# Patient Record
Sex: Female | Born: 1980
Health system: Southern US, Community
[De-identification: ages and names within clinical notes are randomized; demographics above are authoritative.]

## PROBLEM LIST (undated history)

## (undated) HISTORY — PX: BUNIONECTOMY: SHX129

## (undated) HISTORY — PX: KNEE SURGERY: SHX244

## (undated) HISTORY — PX: CHOLECYSTECTOMY OPEN: SUR202

## (undated) HISTORY — PX: ANKLE SURGERY: SHX546

## (undated) HISTORY — PX: TUBAL LIGATION: SHX77

---

## 2000-11-22 ENCOUNTER — Other Ambulatory Visit: Admission: RE | Admit: 2000-11-22 | Discharge: 2000-11-22 | Payer: Self-pay | Admitting: Family Medicine

## 2001-01-17 ENCOUNTER — Encounter: Payer: Self-pay | Admitting: Family Medicine

## 2001-01-17 ENCOUNTER — Ambulatory Visit (HOSPITAL_COMMUNITY): Admission: RE | Admit: 2001-01-17 | Discharge: 2001-01-17 | Payer: Self-pay | Admitting: Family Medicine

## 2001-04-19 ENCOUNTER — Ambulatory Visit (HOSPITAL_COMMUNITY): Admission: RE | Admit: 2001-04-19 | Discharge: 2001-04-19 | Payer: Self-pay | Admitting: Family Medicine

## 2001-04-19 ENCOUNTER — Encounter: Payer: Self-pay | Admitting: Family Medicine

## 2001-06-09 ENCOUNTER — Encounter: Payer: Self-pay | Admitting: Family Medicine

## 2001-06-09 ENCOUNTER — Inpatient Hospital Stay (HOSPITAL_COMMUNITY): Admission: RE | Admit: 2001-06-09 | Discharge: 2001-06-09 | Payer: Self-pay | Admitting: Family Medicine

## 2001-06-11 ENCOUNTER — Inpatient Hospital Stay (HOSPITAL_COMMUNITY): Admission: AD | Admit: 2001-06-11 | Discharge: 2001-06-13 | Payer: Self-pay | Admitting: Family Medicine

## 2001-11-28 ENCOUNTER — Other Ambulatory Visit: Admission: RE | Admit: 2001-11-28 | Discharge: 2001-11-28 | Payer: Self-pay | Admitting: Family Medicine

## 2002-12-20 ENCOUNTER — Other Ambulatory Visit: Admission: RE | Admit: 2002-12-20 | Discharge: 2002-12-20 | Payer: Self-pay | Admitting: Family Medicine

## 2004-07-01 ENCOUNTER — Other Ambulatory Visit: Admission: RE | Admit: 2004-07-01 | Discharge: 2004-07-01 | Payer: Self-pay | Admitting: Family Medicine

## 2004-10-05 ENCOUNTER — Observation Stay (HOSPITAL_COMMUNITY): Admission: AD | Admit: 2004-10-05 | Discharge: 2004-10-07 | Payer: Self-pay | Admitting: Obstetrics and Gynecology

## 2005-02-22 ENCOUNTER — Inpatient Hospital Stay (HOSPITAL_COMMUNITY): Admission: AD | Admit: 2005-02-22 | Discharge: 2005-02-25 | Payer: Self-pay | Admitting: Obstetrics & Gynecology

## 2005-03-19 ENCOUNTER — Other Ambulatory Visit: Admission: RE | Admit: 2005-03-19 | Discharge: 2005-03-19 | Payer: Self-pay | Admitting: Obstetrics and Gynecology

## 2005-03-25 ENCOUNTER — Ambulatory Visit (HOSPITAL_COMMUNITY): Admission: RE | Admit: 2005-03-25 | Discharge: 2005-03-25 | Payer: Self-pay | Admitting: Obstetrics and Gynecology

## 2005-05-22 ENCOUNTER — Inpatient Hospital Stay (HOSPITAL_COMMUNITY): Admission: EM | Admit: 2005-05-22 | Discharge: 2005-05-30 | Payer: Self-pay | Admitting: Emergency Medicine

## 2007-05-19 ENCOUNTER — Emergency Department (HOSPITAL_COMMUNITY): Admission: EM | Admit: 2007-05-19 | Discharge: 2007-05-19 | Payer: Self-pay | Admitting: Emergency Medicine

## 2007-07-24 ENCOUNTER — Ambulatory Visit (HOSPITAL_COMMUNITY): Admission: RE | Admit: 2007-07-24 | Discharge: 2007-07-25 | Payer: Self-pay | Admitting: *Deleted

## 2007-07-24 ENCOUNTER — Encounter (INDEPENDENT_AMBULATORY_CARE_PROVIDER_SITE_OTHER): Payer: Self-pay | Admitting: *Deleted

## 2010-05-19 NOTE — Op Note (Signed)
Jacqueline Andrade, Jacqueline Andrade             ACCOUNT NO.:  [id number]   MEDICAL RECORD NO.:  [id number]          PATIENT TYPE:  OIB   LOCATION:  1343                         FACILITY:  [facility]   PHYSICIAN:  Alfonse Ras, MD   DATE OF BIRTH:  February 19, 1980   DATE OF PROCEDURE:  DATE OF DISCHARGE:                               OPERATIVE REPORT   PREOPERATIVE DIAGNOSIS:  Symptomatic cholelithiasis.   POSTOPERATIVE DIAGNOSIS:  Symptomatic cholelithiasis.   PROCEDURES:  Laparoscopic cholecystectomy.   SURGEON:  Alfonse Ras, M.D.   ASSISTANT:  Anselm Pancoast. Zachery Dakins, M.D.   ANESTHESIA:  General.   DESCRIPTION:  The patient was taken to the operating room, placed in the  supine position after adequate general anesthesia was induced using  endotracheal tube.  The abdomen was prepped and draped in normal sterile  fashion.  Using a transverse infraumbilical incision, I dissected down  to fascia.  Fascia was opened vertically.  An 0-Vicryl pursestring  suture was placed on the fascial defect.  Hasson trocar was placed in  the abdomen and pneumoperitoneum was obtained.  Under direct vision, 11  mm trocar was placed in subxiphoid region.  Two 5 mm trocars were placed  in the right abdomen.  The gallbladder was identified and retracted  cephalad.  Dissection of the neck of the gallbladder obtained a critical  view of the cystic duct, its junction with the gallbladder and the  common bile duct.  It was triply clipped and divided after the critical  view was verified.  Cystic artery was identified in a similar fashion,  triply clipped and divided.  The gallbladder was taken off the  gallbladder bed using Bovie electrocautery.  Small rent was made in this  more superior portion of the gallbladder and moderate sized stone was  retrieved.  The gallbladder was placed in EndoCatch bag.  Adequate  hemostasis was assured.  The right upper quadrant was copiously  irrigated.  The gallbladder was removed  through the umbilical port.  The  pneumoperitoneum was released and infraumbilical fascial defect was  closed with the 0 Vicryl purse-string suture.  Skin incisions were  closed with subcuticular 4-0 Monocryl.  Steri-Strips, sterile dressings  were applied.  The patient tolerated the procedure well, went to PACU in  good condition.      Alfonse Ras, MD  Electronically Signed     KRE/MEDQ  D:  07/24/2007  T:  07/25/2007  Job:  [id number]   cc:   [facility]

## 2010-05-19 NOTE — Consult Note (Signed)
NAME:  Jacqueline Andrade, GOON NO.:  [id number]   MEDICAL RECORD NO.:  [id number]          PATIENT TYPE:  EMS   LOCATION:  MAJO                         FACILITY:  [facility]   PHYSICIAN:  Alfonse Ras, MD   DATE OF BIRTH:  January 06, 1980   DATE OF CONSULTATION:  DATE OF DISCHARGE:                                 CONSULTATION   CHIEF COMPLAINT:  Retrosternal chest pain and history of gallstones.   HISTORY OF PRESENT ILLNESS:  The patient is a very pleasant 30 year old  female with a history of cholecystitis in the past, last 2 years ago and  prior to that was 7 years ago, and known gallstones.  The patient awoke  today at 2 o'clock this morning about 5 hours ago with retrosternal  chest pain and heartburn symptoms.  She tried to treat this with Mylanta  and had no relief.  She denies any right upper quadrant abdominal pain.  She did have a [place] sandwich last night and a McFlurry from  [employer].  She denies any acholic stools or dark urine.   PAST MEDICAL HISTORY:  None.   PAST SURGICAL HISTORY:  Significant only for a tubal ligation.   PHYSICAL EXAMINATION:  Physical exam shows a temperature of 97.2 and  heart rate of 84.  HEENT:  Benign.  Normocephalic and atraumatic.  Pupils are equal, round,  and reactive to light.  NECK:  Supple and soft without thyromegaly or cervical adenopathy.  LUNGS:  Clear to auscultation and percussion x2.  HEART:  Regular rate and rhythm without murmurs, rubs, or gallops.  ABDOMEN:  Soft, completely nontender even with deep palpation in the  right upper quadrant.  EXTREMITIES:  Show no clubbing, cyanosis, or edema.   Ultrasound shows a small contracted gallbladder with gallstones, normal  common bile duct diameter.  The white count is 8,000.  Liver function  tests are completely within normal limits.  Urine pregnancy is negative.   IMPRESSION:  Probable gastroesophageal reflux disease with acute  exacerbation and known history of  cholelithiasis.   PLAN:  Medical treatment of gastroesophageal reflux disease and elective  cholecystectomy.  I have discussed this with the patient and her husband  and they will see me in the office and we will schedule elective  cholecystectomy.      Alfonse Ras, MD  Electronically Signed     KRE/MEDQ  D:  05/19/2007  T:  05/19/2007  Job:  [id number]

## 2010-05-22 NOTE — H&P (Signed)
NAME:  Jacqueline Andrade, Jacqueline Andrade NO.:  [id number]   MEDICAL RECORD NO.:  [id number]          PATIENT TYPE:  AMB   LOCATION:  SDC                           FACILITY:  [facility]   PHYSICIAN:  Juluis Mire, M.D.   DATE OF BIRTH:  [birth year]   DATE OF ADMISSION:  DATE OF DISCHARGE:                                HISTORY & PHYSICAL   The patient is a 30 year old gravida 2, para 2, married female who presents  for laparoscopic bilateral tubal fulguration.  The potential irreversibility  of sterilization was explained.  We discussed alternatives for birth  control.  The patient still desires permanent sterilization.  Potential  failure rate of one in 200 is quoted.  Failures can be in the form of  ectopic pregnancy requiring further surgical management.   ALLERGIES:  No known drug allergies.   MEDICATIONS:  Vitamins.   PAST MEDICAL HISTORY:  The usual childhood diseases, no significant  sequelae.   PAST SURGICAL HISTORY:  She has had a hammer toe fixed, otherwise no other  surgical history.   OBSTETRICAL HISTORY:  Two vaginal deliveries with viable infants.   FAMILY HISTORY:  Noncontributory.   SOCIAL HISTORY:  There is no tobacco or alcohol use.   REVIEW OF SYSTEMS:  Noncontributory.   PHYSICAL EXAMINATION:  VITAL SIGNS:  The patient is afebrile with stable  vital signs.  HEENT:  Patient normocephalic.  Pupils equal, round and reactive to light  and accommodation, extraocular movements were intact.  Sclerae and  conjunctivae clear.  Oropharynx clear.  NECK:  Without thyromegaly.  BREASTS:  Glandular but no discrete masses.  LUNGS:  Clear.  CARDIAC:  Regular rhythm and rate, no murmurs or gallops.  ABDOMEN:  Benign.  No mass, organomegaly or tenderness.  PELVIC:  Normal external genitalia.  Vaginal mucosa is clear.  Cervix  unremarkable.  Uterus normal size, shape and contour.  Adnexa free of mass  or tenderness.  EXTREMITIES:  Trace edema.  NEUROLOGIC:  Grossly within  normal limits.   IMPRESSION:  Multiparity, desires sterility.   PLAN:  The patient will undergo laparoscopic bilateral tubal fulguration.  Risks of surgery have been discussed, including the risk of infection; the  risk of hemorrhage that could require transfusion and the risk of AIDS of  hepatitis; the risk of injury to adjacent organs including bladder, bowel or  ureters, that could require further exploratory surgery; the risk of deep  venous thrombosis and pulmonary embolus.  The patient expressed an  understanding of the indications, risks and other options.      Juluis Mire, M.D.  Electronically Signed     JSM/MEDQ  D:  03/25/2005  T:  03/25/2005  Job:  [id number]
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
## 2010-05-22 NOTE — Op Note (Signed)
[name], Jacqueline Andrade NO.:  [id number]   MEDICAL RECORD NO.:  [id number]          PATIENT TYPE:  AMB   LOCATION:  SDC                           FACILITY:  [facility]   PHYSICIAN:  Juluis Mire, M.D.   DATE OF BIRTH:  [birth year]   DATE OF PROCEDURE:  03/25/2005  DATE OF DISCHARGE:                                 OPERATIVE REPORT   PREOPERATIVE DIAGNOSIS:  Multiparity, desires sterility.   POSTOPERATIVE DIAGNOSIS:  Multiparity, desires sterility.   OPERATION/PROCEDURE:  Laparoscopic bilateral tubal fulguration.   SURGEON:  Juluis Mire, M.D.   ANESTHESIA:  General.   ESTIMATED BLOOD LOSS:  Minimal.   PACKS AND DRAINS:  None.   INTRAOPERATIVE BLOOD REPLACED:  None.   COMPLICATIONS:  None.   INDICATIONS:  Dictated in history and physical.   DESCRIPTION OF PROCEDURE:  The patient was taken to the OR and placed in the  supine position.  After satisfactory level of general anesthesia was  obtained, the patient was placed in the dorsal lithotomy position using the  Allen stirrups.  The abdomen, perineum and vagina were prepped out with  Betadine.  Bladder was drained with in-and-out catheterization.  A Hulka  tenaculum was put in place and secured.  A subumbilical incision was made  with the knife.  The Veress needle inserted into the abdominal cavity  without difficulty.  Abdomen insufflated with approximately  3 L or carbon  dioxide.  Operating laparoscope was introduced.  Visualization of the  revealed a normal appendix.  Upper abdomen including liver and tip of the  gallbladder were cleared.  Tubes and ovaries were unremarkable.  Uterus was  upper limits of normal size.  Using the bipolar, a 2-3 cm segment of each  fallopian tube was coagulated until resistance read 0 on the meter.  We then  recoagulated the same segment of tube, completely desiccating the tube.  Coagulation did extend out to mesosalpinx.  At the end of the procedure both  tubes were  adequately coagulated.  No other pelvic pathology or  complications were encountered.  Abdomen was deflated with  carbon dioxide.  Trocars were removed.  Subumbilical incision was closed  with interrupted subcuticular of 4-0 Vicryl.  The Hulka tenaculum was then  removed.  The patient was taken out of the dorsal lithotomy position and  once alert and extubated, was transferred to the recovery room in good  condition.      Juluis Mire, M.D.  Electronically Signed     JSM/MEDQ  D:  03/25/2005  T:  03/26/2005  Job:  [id number]
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
## 2010-05-22 NOTE — H&P (Signed)
NAMEFRANCESSCA, FRIIS             ACCOUNT NO.:  [id number]   MEDICAL RECORD NO.:  [id number]          PATIENT TYPE:  INP   LOCATION:  9302                          FACILITY:  [facility]   PHYSICIAN:  Dineen Kid. Rana Snare, M.D.    DATE OF BIRTH:  Dec 08, 1980   DATE OF ADMISSION:  10/05/2004  DATE OF DISCHARGE:                                HISTORY & PHYSICAL   HISTORY OF PRESENT ILLNESS:  Ms. Jacqueline Andrade is a 30 year old G2, P1, at 17-6/7  weeks who presented this morning to the [facility] with a  chief complaint of right upper quadrant pain, midepigastric pain, nausea,  and vomiting.  Apparently she underwent full evaluation at the hospital,  including laboratory and ultrasound evaluation, and was told that she had  gallbladder disease, and according to her family, they were ready to do  surgery to remove her gallbladder.  They left to come to our practice and  wanted further evaluation here since she is pregnant.  Her estimated date of  confinement is March 09, 2005, making her 17-6/7 weeks.  She had an  ultrasound last week, showing a female fetus with normal anatomy.  On further  examination, she does have a history of gastroesophageal reflux with pain  and nausea and vomiting really just began this morning.  She has had three  episodes of nausea and vomiting this morning.  At this time she appears to  be in mild to moderate discomfort in the right upper quadrant, but no nausea  or vomiting as of recently.   LABORATORY DATA:  Her laboratory evaluation from [facility] includes  comprehensive metabolic package, which was essentially normal with a normal  amylase and lipase, normal SGOT/SGPT.  Her CBC was also normal with a normal  white count of 9.1, hemoglobin of 12, platelets of 252,000.  Her BUN and  creatinine were also normal, as well as remaining of her electrolytes.  Sodium mildly depressed at 134.  Her total bilirubin and direct bilirubin  were 0.4 and 0.2 with indirect  bilirubin of 0.2, all within normal limits.  She also had urinalysis which showed trace ketones and an ultrasound  evaluation again performed at [facility] which shows no pancreatic  lesions, normal hepatopetal flow within the main portal vein.  Hepatic veins  are also patent.  The gallbladder wall is not thickened.  Numerous calculi  are seen within the gallbladder, represent cholelithiasis with the largest  calculus measuring 9 mm.  There is a positive Murphy's sign, but no  pericholecystic fluid is seen.  No polyp or masses are seen.  The bile ducts  are normal in caliber.   PHYSICAL EXAMINATION:  ABDOMEN:  Gravid.  Mild tenderness in the right upper  quadrant to mid epigastric area.  No rebound or guarding is noted.  Normal  active bowel sounds are noted.  No flank pain.  Fetal heart tones are in the  150s.   IMPRESSION/PLAN:  Right upper quadrant pain and mid epigastric pain with  normal laboratory evaluation.  Gallbladder ultrasound shows cholelithiasis.   PLAN:  Admit for IV fluids, antiemetics.  No evidence of infection at this  time with a normal white count and afebrile.  Will plan management of emesis  and IV fluids tonight and further evaluation tomorrow.  If she is not  improving, consider GI referral or evaluation by general surgeon.     Dineen Kid Rana Snare, M.D.  Electronically Signed    DCL/MEDQ  D:  10/05/2004  T:  10/05/2004  Job:  [id number]

## 2010-05-22 NOTE — Discharge Summary (Signed)
Jacqueline Andrade, Jacqueline Andrade             ACCOUNT NO.:  [id number]   MEDICAL RECORD NO.:  [id number]          PATIENT TYPE:  INP   LOCATION:  9302                          FACILITY:  [facility]   PHYSICIAN:  Duke Salvia. Marcelle Overlie, M.D.DATE OF BIRTH:  1980-06-14   DATE OF ADMISSION:  10/05/2004  DATE OF DISCHARGE:  10/07/2004                                 DISCHARGE SUMMARY   ADMISSION DIAGNOSES:  1.  Intrauterine pregnancy at 17-6/7 weeks estimated gestational age.  2.  Right upper quadrant pain.  3.  Mild epigastric pain.   DISCHARGE DIAGNOSES:  1.  Intrauterine pregnancy at 18 weeks estimated gestational age.  2.  Cholelithiasis.  3.  Gastroesophageal reflux disease.   REASON FOR ADMISSION:  Please see dictated H&P.   HOSPITAL COURSE:  The patient is 30 year old gravida 2, para 1 that was  admitted to [facility] at 17-6/7 weeks. Chief complaint was  right upper quadrant pain and mid gastric pain, nausea, vomiting. The  patient had received her prenatal care through [facility]. The  patient apparently had undergone full evaluation at the hospital including  laboratory, and ultrasound evaluation. The patient had been told that she  had gallbladder disease and it was recommended she have surgical removal of  her gallbladder. Due to the fact the patient was pregnant, family decided  they wanted further evaluation and a second opinion. Upon further evaluation  the patient did have a history of a gastroesophageal reflux with pain and  nausea and vomiting that really had just started the morning of admission.  The patient had had three episodes of nausea, vomiting. At the time of  admission the patient appeared to be in mild to moderate discomfort in the  right upper quadrant but no nausea, vomiting at that time.   LABORATORY DATA:  Revealed a normal amylase and lipase with normal SGOT and  SGPT. CBC was also normal with a white count of 9.1 thousand, a hemoglobin  of 12,  platelet count of 252,000. BUN and creatinine were also within normal  limits as well as the remaining electrolytes. Sodium was mildly depressed at  134. Total bilirubin, direct and indirect well within normal limits.  Urinalysis had shown trace ketones. Ultrasound evaluation that had been  performed at [facility] had also had also revealed no pancreatic  lesions with normal hepatic flow in the main portal vein. Gallbladder wall  was not thickened. Numerous calculi however were seen within the gallbladder  representing chololithiasis with the largest calculus measuring  approximately 9 mm. Further exam had revealed a gravid uterus with mild  tenderness in the right upper quadrant to mid epigastric area, no rebound or  guarding was noted. Bowel sounds were noted to be normal. No flank pain.  Fetal heart tones were in the 150's.  Decision was made to admit the patient  for IV fluids and antibiotics. On the following morning the patient did  report  some mild nausea, however, she had not had any further emesis. She  did complain of some epigastric pain and chest pressure. She denied  shortness of breath and or  diaphoresis. No contractions were noted. No  vaginal bleeding, and fetus was active per patient report. Vital signs were  stable. Temperature max 98.6. Heart was noted to be regular rate and rhythm.  Abdomen soft and nontender. LFTs continued to be within normal limits. The  patient was started on Protonix and Zofran, and a bland diet. Due to the  complaints of chest pain an EKG was performed. However, we suspected reflux.  Later that afternoon the patient was feeling better. She had no further  nausea, vomiting. An EKG was normal sinus rhythm. Vital signs were stable  and she continued to be afebrile. On the following morning the patient  stated that she felt much improved. She denied any shortness of breath,  vital signs were stable. She was afebrile. Abdomen soft with good  bowel  sounds. Uterus was nontender. The patient was later discharged home.   CONDITION ON DISCHARGE:  Stable. Diet as tolerated.   ACTIVITY:  No limitations.   FOLLOW UP:  The patient was scheduled to return to the office in 1 week. She  is to call for increase of nausea, vomiting or right upper quadrant pain,  vaginal bleeding or increase in uterine  contractions.   DISCHARGE MEDICATIONS:  Protonix 40 milligrams one p.o. daily. Prenatal  vitamins one p.o. daily.      Julio Sicks, N.P.      Richard M. Marcelle Overlie, M.D.  Electronically Signed    CC/MEDQ  D:  12/15/2004  T:  12/15/2004  Job:  [id number]
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
## 2010-09-30 LAB — URINALYSIS, ROUTINE W REFLEX MICROSCOPIC
Bilirubin Urine: NEGATIVE
Glucose, UA: NEGATIVE
Ketones, ur: NEGATIVE
Nitrite: NEGATIVE
Protein, ur: NEGATIVE
Specific Gravity, Urine: 1.015
Urobilinogen, UA: 1
pH: 7.5

## 2010-09-30 LAB — COMPREHENSIVE METABOLIC PANEL
ALT: 26
Albumin: 3.5
BUN: 6
Calcium: 9.3
Chloride: 105
Creatinine, Ser: 0.56
GFR calc Af Amer: >60
GFR calc non Af Amer: >60
Sodium: 138
Total Protein: 6.9

## 2010-09-30 LAB — COMPREHENSIVE METABOLIC PANEL WITH GFR
AST: 27
Alkaline Phosphatase: 65
CO2: 25
Glucose, Bld: 118 — ABNORMAL HIGH
Potassium: 3.6
Total Bilirubin: 0.6

## 2010-09-30 LAB — DIFFERENTIAL
Basophils Absolute: 0
Basophils Relative: 0
Eosinophils Absolute: 0.1
Eosinophils Relative: 1
Lymphocytes Relative: 22
Lymphs Abs: 1.8
Monocytes Absolute: 0.5
Monocytes Relative: 5
Neutro Abs: 6.1
Neutrophils Relative %: 72

## 2010-09-30 LAB — PROTIME-INR
INR: 1
Prothrombin Time: 12.9

## 2010-09-30 LAB — PREGNANCY, URINE: Preg Test, Ur: NEGATIVE

## 2010-09-30 LAB — APTT: aPTT: 30

## 2010-09-30 LAB — URINE MICROSCOPIC-ADD ON

## 2010-09-30 LAB — CBC
HCT: 35.7 — ABNORMAL LOW
Hemoglobin: 12.2
MCHC: 34.1
MCV: 84.2
Platelets: 269
RBC: 4.24
RDW: 14.1
WBC: 8.5

## 2010-09-30 LAB — URINE CULTURE: Colony Count: >=100000

## 2010-09-30 LAB — LIPASE, BLOOD: Lipase: 19

## 2010-10-02 LAB — DIFFERENTIAL
Eosinophils Relative: 1
Lymphocytes Relative: 28
Lymphs Abs: 1.9
Neutrophils Relative %: 64

## 2010-10-02 LAB — COMPREHENSIVE METABOLIC PANEL
AST: 21
CO2: 23
Calcium: 8.9
Creatinine, Ser: 0.52
GFR calc Af Amer: >60
GFR calc non Af Amer: >60
Total Protein: 7.1

## 2010-10-02 LAB — CBC
MCHC: 33.6
MCV: 83.5
Platelets: 235
RBC: 4.39
RDW: 13.8

## 2012-07-19 ENCOUNTER — Encounter: Payer: Self-pay | Admitting: *Deleted

## 2012-07-24 ENCOUNTER — Other Ambulatory Visit: Payer: Self-pay | Admitting: General Practice

## 2012-07-26 ENCOUNTER — Ambulatory Visit (INDEPENDENT_AMBULATORY_CARE_PROVIDER_SITE_OTHER): Payer: BC Managed Care – PPO | Admitting: General Practice

## 2012-07-26 ENCOUNTER — Encounter: Payer: Self-pay | Admitting: General Practice

## 2012-07-26 VITALS — BP 117/82 | HR 71 | Temp 97.9°F | Ht 66.0 in | Wt 162.0 lb

## 2012-07-26 DIAGNOSIS — Z124 Encounter for screening for malignant neoplasm of cervix: Secondary | ICD-10-CM

## 2012-07-26 DIAGNOSIS — R5381 Other malaise: Secondary | ICD-10-CM

## 2012-07-26 DIAGNOSIS — Z0000 Encounter for general adult medical examination without abnormal findings: Secondary | ICD-10-CM

## 2012-07-26 DIAGNOSIS — Z833 Family history of diabetes mellitus: Secondary | ICD-10-CM

## 2012-07-26 LAB — POCT CBC
MCHC: 32.6 g/dL (ref 31.8–35.4)
MPV: 7.3 fL (ref 0–99.8)
POC Granulocyte: 6.5 (ref 2–6.9)
POC LYMPH PERCENT: 25.5 %L (ref 10–50)
RDW, POC: 14 %
WBC: 9.3 10*3/uL (ref 4.6–10.2)

## 2012-07-26 LAB — COMPLETE METABOLIC PANEL WITH GFR
CO2: 27 mEq/L (ref 19–32)
Creat: 0.59 mg/dL (ref 0.50–1.10)
GFR, Est African American: >89 mL/min
GFR, Est Non African American: >89 mL/min
Glucose, Bld: 91 mg/dL (ref 70–99)
Total Bilirubin: 0.3 mg/dL (ref 0.3–1.2)

## 2012-07-26 LAB — POCT GLYCOSYLATED HEMOGLOBIN (HGB A1C): Hemoglobin A1C: 5.4

## 2012-07-26 NOTE — Progress Notes (Signed)
  Subjective:    Patient ID: Jacqueline Andrade, female    DOB: 1980-03-01, 32 y.o.   MRN: [id number]  HPI Patient presents today for annual physical. She denies having any complaints at this time. She reports working on eating healthier and regular exercise. She reports last menstrual cycle began the last week of June.     Review of Systems  Constitutional: Negative for fever and chills.  HENT: Negative for neck pain and neck stiffness.   Respiratory: Negative for cough, chest tightness and shortness of breath.   Cardiovascular: Negative for chest pain and palpitations.  Gastrointestinal: Negative for abdominal pain and blood in stool.  Genitourinary: Negative for dysuria, hematuria and difficulty urinating.  Neurological: Negative for dizziness, weakness and headaches.       Objective:   Physical Exam  Constitutional: She is oriented to person, place, and time. She appears well-developed and well-nourished.  HENT:  Head: Normocephalic and atraumatic.  Right Ear: External ear normal.  Left Ear: External ear normal.  Mouth/Throat: Oropharynx is clear and moist.  Eyes: EOM are normal. Pupils are equal, round, and reactive to light.  Neck: Normal range of motion. Neck supple. No thyromegaly present.  Cardiovascular: Normal rate, regular rhythm and normal heart sounds.   Pulmonary/Chest: Effort normal and breath sounds normal. No respiratory distress. She exhibits no tenderness. Right breast exhibits no inverted nipple, no mass, no nipple discharge, no skin change and no tenderness. Left breast exhibits no inverted nipple, no mass, no nipple discharge, no skin change and no tenderness. Breasts are symmetrical.  Abdominal: Soft. Bowel sounds are normal. She exhibits no distension. There is no tenderness.  Genitourinary: No breast swelling, tenderness, discharge or bleeding. There is no rash, tenderness, lesion or injury on the right labia. There is no rash, tenderness, lesion or injury on  the left labia. Uterus is not deviated, not enlarged, not fixed and not tender. Cervix exhibits no motion tenderness, no discharge and no friability. Right adnexum displays no mass, no tenderness and no fullness. Left adnexum displays no mass, no tenderness and no fullness. No erythema, tenderness or bleeding around the vagina. No foreign body around the vagina. No signs of injury around the vagina. No vaginal discharge found.  Musculoskeletal: Normal range of motion.  Lymphadenopathy:    She has no cervical adenopathy.  Neurological: She is alert and oriented to person, place, and time.  Skin: Skin is warm and dry.  Psychiatric: She has a normal mood and affect.          Assessment & Plan:  1. Annual physical exam - POCT CBC - COMPLETE METABOLIC PANEL WITH GFR - NMR Lipoprofile with Lipids - Pap IG, CT/NG w/ reflex HPV when ASC-U  2. Other malaise and fatigue - Thyroid Panel With TSH - Vitamin B12  3. Family history of diabetes mellitus - POCT glycosylated hemoglobin (Hb A1C) -Continue all current medications Labs pending F/u in 1 year Discussed exercise and healthy eating Patient verbalized understanding Coralie Keens, FNP-C

## 2012-07-26 NOTE — Patient Instructions (Addendum)

## 2012-07-27 LAB — NMR LIPOPROFILE WITH LIPIDS
HDL Particle Number: 30.1 umol/L — ABNORMAL LOW (ref >=30.5)
LDL (calc): 99 mg/dL (ref <100)
LDL Particle Number: 1536 nmol/L — ABNORMAL HIGH (ref <1000)
LDL Size: 20.6 nm (ref >20.5)
LP-IR Score: 64 — ABNORMAL HIGH (ref <=45)
Small LDL Particle Number: 781 nmol/L — ABNORMAL HIGH (ref <=527)
VLDL Size: 57 nm — ABNORMAL HIGH (ref <=46.6)

## 2012-07-27 LAB — PAP IG, CT-NG, RFX HPV ASCU: GC Probe Amp: NEGATIVE

## 2012-07-27 LAB — THYROID PANEL WITH TSH: T4, Total: 8.6 ug/dL (ref 5.0–12.5)

## 2013-08-17 ENCOUNTER — Encounter: Payer: Self-pay | Admitting: Family

## 2013-08-17 ENCOUNTER — Other Ambulatory Visit: Payer: Self-pay | Admitting: Family

## 2013-08-17 ENCOUNTER — Ambulatory Visit (INDEPENDENT_AMBULATORY_CARE_PROVIDER_SITE_OTHER): Payer: BC Managed Care – PPO | Admitting: Family

## 2013-08-17 ENCOUNTER — Encounter (INDEPENDENT_AMBULATORY_CARE_PROVIDER_SITE_OTHER): Payer: Self-pay

## 2013-08-17 VITALS — BP 105/61 | HR 67 | Temp 99.0°F | Ht 66.0 in | Wt 177.8 lb

## 2013-08-17 DIAGNOSIS — Z124 Encounter for screening for malignant neoplasm of cervix: Secondary | ICD-10-CM

## 2013-08-17 DIAGNOSIS — Z0000 Encounter for general adult medical examination without abnormal findings: Secondary | ICD-10-CM

## 2013-08-17 DIAGNOSIS — Z01419 Encounter for gynecological examination (general) (routine) without abnormal findings: Secondary | ICD-10-CM

## 2013-08-17 DIAGNOSIS — Z202 Contact with and (suspected) exposure to infections with a predominantly sexual mode of transmission: Secondary | ICD-10-CM

## 2013-08-17 LAB — POCT URINALYSIS DIPSTICK
Bilirubin, UA: NEGATIVE
Glucose, UA: NEGATIVE
Nitrite, UA: NEGATIVE
PH UA: 5
SPEC GRAV UA: 1.025

## 2013-08-17 LAB — POCT UA - MICROSCOPIC ONLY
CASTS, UR, LPF, POC: NEGATIVE
CRYSTALS, UR, HPF, POC: NEGATIVE
Yeast, UA: NEGATIVE

## 2013-08-17 MED ORDER — NITROFURANTOIN MONOHYD MACRO 100 MG PO CAPS: 100.0000 mg | ORAL_CAPSULE | Freq: Two times a day (BID) | ORAL | Status: DC

## 2013-08-17 NOTE — Progress Notes (Signed)
   Subjective:    Patient ID: Jacqueline Andrade, female    DOB: 1980-08-22, 33 y.o.   MRN: [id number]  Gynecologic Exam Pertinent negatives include no headaches.   Pt presents to the office for annual physical with pap. Pt denies any pain, palpations, SOB, or edema. No complaints at this time.    Review of Systems  Constitutional: Negative.   HENT: Negative.   Eyes: Negative.   Respiratory: Negative.  Negative for shortness of breath.   Cardiovascular: Negative.  Negative for palpitations.  Gastrointestinal: Negative.   Endocrine: Negative.   Genitourinary: Negative.   Musculoskeletal: Negative.   Neurological: Negative.  Negative for headaches.  Hematological: Negative.   Psychiatric/Behavioral: Negative.   All other systems reviewed and are negative.      Objective:   Physical Exam  Vitals reviewed. Constitutional: She is oriented to person, place, and time. She appears well-developed and well-nourished. No distress.  HENT:  Head: Normocephalic and atraumatic.  Right Ear: External ear normal.  Mouth/Throat: Oropharynx is clear and moist.  Eyes: Pupils are equal, round, and reactive to light.  Neck: Normal range of motion. Neck supple. No thyromegaly present.  Cardiovascular: Normal rate, regular rhythm, normal heart sounds and intact distal pulses.   No murmur heard. Pulmonary/Chest: Effort normal and breath sounds normal. No respiratory distress. She has no wheezes. Right breast exhibits no inverted nipple, no mass, no nipple discharge, no skin change and no tenderness. Left breast exhibits no inverted nipple, no mass, no nipple discharge, no skin change and no tenderness. Breasts are symmetrical.  Abdominal: Soft. Bowel sounds are normal. She exhibits no distension. There is no tenderness.  Genitourinary: Vagina normal. Guaiac negative stool.  Bimanual exam- no adnexal masses or tenderness, ovaries nonpalpable   Cervix parous and pink- No discharge   Musculoskeletal:  Normal range of motion. She exhibits no edema and no tenderness.  Neurological: She is alert and oriented to person, place, and time. She has normal reflexes. No cranial nerve deficit.  Skin: Skin is warm and dry.  Psychiatric: She has a normal mood and affect. Her behavior is normal. Judgment and thought content normal.    BP 105/61  Pulse 67  Temp(Src) 99 F (37.2 C) (Oral)  Ht 5\' 6"  (1.676 m)  Wt 177 lb 12.8 oz (80.65 kg)  BMI 28.71 kg/m2       Assessment & Plan:  1. Annual physical exam - Pap IG, CT/NG w/ reflex HPV when ASC-U - POCT UA - Microscopic Only - POCT urinalysis dipstick  2. Encounter for routine gynecological examination  3. Potential exposure to STD - Pap IG, CT/NG w/ reflex HPV when ASC-U - STD Screening Panel/High Risk - GC/Chlamydia Probe Amp - HIV antibody (with reflex)   Labs discussed-Pt had blood work drawn at work and will be scanned into chart Health Maintenance reviewed Diet and exercise encouraged RTO 1 year  Jannifer Rodneyhristy Hawks, FNP

## 2013-08-17 NOTE — Patient Instructions (Signed)

## 2013-08-17 NOTE — Addendum Note (Signed)
Addended by: Prescott GumLAND, Mashonda Broski M on: 08/17/2013 11:47 AM   Modules accepted: Orders

## 2013-08-18 LAB — STD SCREENING PANEL/HIGH RISK
HIV 1/HIV 2 AB: NONREACTIVE
HIV 1/O/2 Abs-Index Value: <1 (ref <1.00)
HSV 1 IGG, TYPE SPEC: 52 {index} — AB (ref 0.00–0.90)
Hep A IgM: NEGATIVE
Hep B C IgM: NEGATIVE
Hepatitis B Surface Ag: NEGATIVE
SYPHILIS RPR SCR: NONREACTIVE

## 2013-08-23 LAB — GC/CHLAMYDIA PROBE AMP
Chlamydia trachomatis, NAA: NEGATIVE
Neisseria gonorrhoeae by PCR: NEGATIVE

## 2013-08-24 LAB — PAP IG, CT-NG, RFX HPV ASCU
Chlamydia, Nuc. Acid Amp: NEGATIVE
GONOCOCCUS BY NUCLEIC ACID AMP: NEGATIVE
PAP Smear Comment: 0

## 2013-10-29 ENCOUNTER — Ambulatory Visit (INDEPENDENT_AMBULATORY_CARE_PROVIDER_SITE_OTHER): Payer: BC Managed Care – PPO | Admitting: *Deleted

## 2013-10-29 DIAGNOSIS — Z23 Encounter for immunization: Secondary | ICD-10-CM

## 2013-11-05 ENCOUNTER — Encounter: Payer: Self-pay | Admitting: Family

## 2014-08-01 ENCOUNTER — Ambulatory Visit (INDEPENDENT_AMBULATORY_CARE_PROVIDER_SITE_OTHER): Payer: BLUE CROSS/BLUE SHIELD

## 2014-08-01 ENCOUNTER — Encounter: Payer: Self-pay | Admitting: Family

## 2014-08-01 ENCOUNTER — Ambulatory Visit (INDEPENDENT_AMBULATORY_CARE_PROVIDER_SITE_OTHER): Payer: BLUE CROSS/BLUE SHIELD | Admitting: Family

## 2014-08-01 VITALS — BP 119/85 | HR 89 | Temp 97.9°F | Ht 66.0 in | Wt 183.8 lb

## 2014-08-01 DIAGNOSIS — M25571 Pain in right ankle and joints of right foot: Secondary | ICD-10-CM

## 2014-08-01 DIAGNOSIS — M19071 Primary osteoarthritis, right ankle and foot: Secondary | ICD-10-CM

## 2014-08-01 DIAGNOSIS — M129 Arthropathy, unspecified: Secondary | ICD-10-CM

## 2014-08-01 DIAGNOSIS — M25471 Effusion, right ankle: Secondary | ICD-10-CM

## 2014-08-01 MED ORDER — TRAMADOL HCL 50 MG PO TABS: 50.0000 mg | ORAL_TABLET | Freq: Three times a day (TID) | ORAL | Status: DC | PRN

## 2014-08-01 NOTE — Patient Instructions (Signed)
Ankle Exercises for Rehabilitation Following ankle injuries, it is as important to follow your caregiver's instructions for regaining full use of your ankle as it was to follow the initial treatment plan following the injury. The following are some suggestions for exercises and treatment, which can be done to help you regain full use of your ankle as soon as possible.  Follow all instructions regarding physical therapy.  Before exercising, it may be helpful to use heat on the muscles or joint being exercised. This loosens up the muscles and tendons (cordlike structure) and decreases chances of injury during your exercises. If this is not possible, just begin your exercises slowly to gradually warm up.  Stand on your toes several times per day to strengthen the calf muscles. These are the muscles in the back of your leg between the knee and the heel. The cord you can feel just above the heel is the Achilles tendon. Rise up on your toes several times repeating this three to four times per day. Do not exercise to the point of pain. If pain starts to develop, decrease the exercise until you are comfortable again.  Do range of motion exercises. This means moving the ankle in all directions. Practice writing the alphabet with your toes in the air. Do not increase beyond a range that is comfortable.  Increase the strength of the muscles in the front of your leg by raising your toes and foot straight up in the air. Repeat this exercise as you did the calf exercise with the same warnings. This also help to stretch your muscles.  Stretch your calf muscles also by leaning against a wall with your hands in front of you. Put your feet a few feet from the wall and bend your knees until you feel the muscles in your calves become tight.  After exercising it may be helpful to put ice on the ankle to prevent swelling and improve rehabilitation. This may be done for 15 to 20 minutes following your exercises. If  exercising is being done in the workplace, this may not always be possible.  Taping an ankle injury may be helpful to give added support following an injury. It also may help prevent reinjury. This may be true if you are in training or in a conditioning program. You and your caregiver can decide on the best course of action to follow. Document Released: 12/19/1999 Document Revised: 05/07/2013 Document Reviewed: 12/16/2007 Exit[employer] Patient Information 2015 [employer]. This information is not intended to replace advice given to you by your health care provider. Make sure you discuss any questions you have with your health care provider.  
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
## 2014-08-19 ENCOUNTER — Other Ambulatory Visit: Payer: Self-pay | Admitting: Family

## 2014-08-19 ENCOUNTER — Ambulatory Visit (INDEPENDENT_AMBULATORY_CARE_PROVIDER_SITE_OTHER): Payer: BLUE CROSS/BLUE SHIELD | Admitting: Family

## 2014-08-19 ENCOUNTER — Encounter: Payer: Self-pay | Admitting: Family

## 2014-08-19 VITALS — BP 115/77 | HR 72 | Temp 98.6°F | Ht 66.0 in | Wt 185.6 lb

## 2014-08-19 DIAGNOSIS — M129 Arthropathy, unspecified: Secondary | ICD-10-CM | POA: Diagnosis not present

## 2014-08-19 DIAGNOSIS — Z0000 Encounter for general adult medical examination without abnormal findings: Secondary | ICD-10-CM

## 2014-08-19 DIAGNOSIS — M19071 Primary osteoarthritis, right ankle and foot: Secondary | ICD-10-CM

## 2014-08-19 LAB — POCT UA - MICROSCOPIC ONLY
BACTERIA, U MICROSCOPIC: NEGATIVE
CASTS, UR, LPF, POC: NEGATIVE
Crystals, Ur, HPF, POC: NEGATIVE
Mucus, UA: NEGATIVE
YEAST UA: NEGATIVE

## 2014-08-19 LAB — POCT URINALYSIS DIPSTICK
Bilirubin, UA: NEGATIVE
Glucose, UA: NEGATIVE
Ketones, UA: NEGATIVE
Leukocytes, UA: NEGATIVE
NITRITE UA: NEGATIVE
PH UA: 6
PROTEIN UA: NEGATIVE
Spec Grav, UA: 1.015
UROBILINOGEN UA: NEGATIVE

## 2014-08-19 NOTE — Patient Instructions (Signed)
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
## 2014-08-20 ENCOUNTER — Other Ambulatory Visit: Payer: Self-pay | Admitting: Family

## 2014-08-20 DIAGNOSIS — Z01419 Encounter for gynecological examination (general) (routine) without abnormal findings: Secondary | ICD-10-CM

## 2014-08-20 LAB — CBC WITH DIFFERENTIAL/PLATELET
BASOS ABS: 0 10*3/uL (ref 0.0–0.2)
BASOS: 0 %
EOS (ABSOLUTE): 0.1 10*3/uL (ref 0.0–0.4)
Eos: 1 %
HEMOGLOBIN: 12.5 g/dL (ref 11.1–15.9)
Hematocrit: 37.4 % (ref 34.0–46.6)
Immature Grans (Abs): 0 10*3/uL (ref 0.0–0.1)
Immature Granulocytes: 0 %
LYMPHS ABS: 2 10*3/uL (ref 0.7–3.1)
Lymphs: 38 %
MCH: 28.3 pg (ref 26.6–33.0)
MCHC: 33.4 g/dL (ref 31.5–35.7)
MCV: 85 fL (ref 79–97)
MONOCYTES: 5 %
Monocytes Absolute: 0.3 10*3/uL (ref 0.1–0.9)
NEUTROS ABS: 3 10*3/uL (ref 1.4–7.0)
Neutrophils: 56 %
Platelets: 270 10*3/uL (ref 150–379)
RBC: 4.42 x10E6/uL (ref 3.77–5.28)
RDW: 14.5 % (ref 12.3–15.4)
WBC: 5.3 10*3/uL (ref 3.4–10.8)

## 2014-08-20 LAB — CMP14+EGFR
ALK PHOS: 73 IU/L (ref 39–117)
ALT: 17 IU/L (ref 0–32)
AST: 14 IU/L (ref 0–40)
Albumin/Globulin Ratio: 1.1 (ref 1.1–2.5)
Albumin: 4 g/dL (ref 3.5–5.5)
BUN/Creatinine Ratio: 17 (ref 8–20)
BUN: 10 mg/dL (ref 6–20)
Bilirubin Total: 0.3 mg/dL (ref 0.0–1.2)
CHLORIDE: 101 mmol/L (ref 97–108)
CO2: 20 mmol/L (ref 18–29)
CREATININE: 0.59 mg/dL (ref 0.57–1.00)
Calcium: 9.1 mg/dL (ref 8.7–10.2)
GFR calc Af Amer: 138 mL/min/{1.73_m2} (ref >59)
GFR calc non Af Amer: 120 mL/min/{1.73_m2} (ref >59)
GLUCOSE: 114 mg/dL — AB (ref 65–99)
Globulin, Total: 3.7 g/dL (ref 1.5–4.5)
Potassium: 3.9 mmol/L (ref 3.5–5.2)
Sodium: 138 mmol/L (ref 134–144)
Total Protein: 7.7 g/dL (ref 6.0–8.5)

## 2014-08-20 LAB — VITAMIN D 25 HYDROXY (VIT D DEFICIENCY, FRACTURES): VIT D 25 HYDROXY: 25.1 ng/mL — AB (ref 30.0–100.0)

## 2014-08-20 LAB — THYROID PANEL WITH TSH
FREE THYROXINE INDEX: 2.4 (ref 1.2–4.9)
T3 Uptake Ratio: 26 % (ref 24–39)
T4 TOTAL: 9.3 ug/dL (ref 4.5–12.0)
TSH: 2.39 u[IU]/mL (ref 0.450–4.500)

## 2014-08-20 MED ORDER — VITAMIN D (ERGOCALCIFEROL) 1.25 MG (50000 UNIT) PO CAPS: 50000.0000 [IU] | ORAL_CAPSULE | ORAL | Status: DC

## 2014-08-20 NOTE — Addendum Note (Signed)
Addended by: Prescott Gum on: 08/20/2014 08:53 AM   Modules accepted: Kipp Brood

## 2014-08-22 ENCOUNTER — Encounter: Payer: Self-pay | Admitting: *Deleted

## 2014-08-22 LAB — PAP IG W/ RFLX HPV ASCU: PAP Smear Comment: 0

## 2014-09-27 ENCOUNTER — Ambulatory Visit (INDEPENDENT_AMBULATORY_CARE_PROVIDER_SITE_OTHER): Payer: BLUE CROSS/BLUE SHIELD | Admitting: Family Medicine

## 2014-09-27 ENCOUNTER — Encounter: Payer: Self-pay | Admitting: Family Medicine

## 2014-09-27 VITALS — BP 112/76 | HR 76 | Temp 98.0°F | Ht 66.0 in | Wt 188.8 lb

## 2014-09-27 DIAGNOSIS — G90521 Complex regional pain syndrome I of right lower limb: Secondary | ICD-10-CM
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
## 2014-10-04 ENCOUNTER — Ambulatory Visit (INDEPENDENT_AMBULATORY_CARE_PROVIDER_SITE_OTHER): Payer: BLUE CROSS/BLUE SHIELD

## 2014-10-04 DIAGNOSIS — Z23 Encounter for immunization: Secondary | ICD-10-CM

## 2014-10-21 ENCOUNTER — Telehealth: Payer: Self-pay | Admitting: Family Medicine

## 2014-10-21 NOTE — Telephone Encounter (Signed)
Spoke with [place] .

## 2014-11-08 ENCOUNTER — Ambulatory Visit: Payer: BLUE CROSS/BLUE SHIELD | Admitting: Family

## 2014-11-09 ENCOUNTER — Encounter: Payer: Self-pay | Admitting: Family

## 2015-04-15 ENCOUNTER — Encounter: Payer: Self-pay | Admitting: Nurse Practitioner

## 2015-04-15 ENCOUNTER — Ambulatory Visit (INDEPENDENT_AMBULATORY_CARE_PROVIDER_SITE_OTHER): Payer: BLUE CROSS/BLUE SHIELD | Admitting: Nurse Practitioner

## 2015-04-15 VITALS — BP 121/83 | HR 73 | Temp 99.3°F | Ht 66.0 in | Wt 203.0 lb

## 2015-04-15 DIAGNOSIS — M79675 Pain in left toe(s): Secondary | ICD-10-CM | POA: Diagnosis not present

## 2015-04-15 DIAGNOSIS — M79674 Pain in right toe(s): Secondary | ICD-10-CM

## 2015-04-15 MED ORDER — GABAPENTIN 300 MG PO CAPS: 300.0000 mg | ORAL_CAPSULE | Freq: Every day | ORAL | Status: DC

## 2015-04-15 NOTE — Patient Instructions (Signed)
Neuropathic Pain Neuropathic pain is pain caused by damage to the nerves that are responsible for certain sensations in your body (sensory nerves). The pain can be caused by damage to:   The sensory nerves that send signals to your spinal cord and brain (peripheral nervous system).  The sensory nerves in your brain or spinal cord (central nervous system). Neuropathic pain can make you more sensitive to pain. What would be a minor sensation for most people may feel very painful if you have neuropathic pain. This is usually a long-term condition that can be difficult to treat. The type of pain can differ from person to person. It may start suddenly (acute), or it may develop slowly and last for a long time (chronic). Neuropathic pain may come and go as damaged nerves heal or may stay at the same level for years. It often causes emotional distress, loss of sleep, and a lower quality of life. CAUSES  The most common cause of damage to a sensory nerve is diabetes. Many other diseases and conditions can also cause neuropathic pain. Causes of neuropathic pain can be classified as:  Toxic. Many drugs and chemicals can cause toxic damage. The most common cause of toxic neuropathic pain is damage from drug treatment for cancer (chemotherapy).  Metabolic. This type of pain can happen when a disease causes imbalances that damage nerves. Diabetes is the most common of these diseases. Vitamin B deficiency caused by long-term alcohol abuse is another common cause.  Traumatic. Any injury that cuts, crushes, or stretches a nerve can cause damage and pain. A common example is feeling pain after losing an arm or leg (phantom limb pain).  Compression-related. If a sensory nerve gets trapped or compressed for a long period of time, the blood supply to the nerve can be cut off.  Vascular. Many blood vessel diseases can cause neuropathic pain by decreasing blood supply and oxygen to nerves.  Autoimmune. This type of  pain results from diseases in which the body's defense system mistakenly attacks sensory nerves. Examples of autoimmune diseases that can cause neuropathic pain include lupus and multiple sclerosis.  Infectious. Many types of viral infections can damage sensory nerves and cause pain. Shingles infection is a common cause of this type of pain.  Inherited. Neuropathic pain can be a symptom of many diseases that are passed down through families (genetic). SIGNS AND SYMPTOMS  The main symptom is pain. Neuropathic pain is often described as:  Burning.  Shock-like.  Stinging.  Hot or cold.  Itching. DIAGNOSIS  No single test can diagnose neuropathic pain. Your health care provider will do a physical exam and ask you about your pain. You may use a pain scale to describe how bad your pain is. You may also have tests to see if you have a high sensitivity to pain and to help find the cause and location of any sensory nerve damage. These tests may include:  Imaging studies, such as:  X-rays.  CT scan.  MRI.  Nerve conduction studies to test how well nerve signals travel through your sensory nerves (electrodiagnostic testing).  Stimulating your sensory nerves through electrodes on your skin and measuring the response in your spinal cord and brain (somatosensory evoked potentials). TREATMENT  Treatment for neuropathic pain may change over time. You may need to try different treatment options or a combination of treatments. Some options include:  Over-the-counter pain relievers.  Prescription medicines. Some medicines used to treat other conditions may also help neuropathic pain. These   include medicines to:  Control seizures (anticonvulsants).  Relieve depression (antidepressants).  Prescription-strength pain relievers (narcotics). These are usually used when other pain relievers do not help.  Transcutaneous nerve stimulation (TENS). This uses electrical currents to block painful nerve  signals. The treatment is painless.  Topical and local anesthetics. These are medicines that numb the nerves. They can be injected as a nerve block or applied to the skin.  Alternative treatments, such as:  Acupuncture.  Meditation.  Massage.  Physical therapy.  Pain management programs.  Counseling. HOME CARE INSTRUCTIONS  Learn as much as you can about your condition.  Take medicines only as directed by your health care provider.  Work closely with all your health care providers to find what works best for you.  Have a good support system at home.  Consider joining a chronic pain support group. SEEK MEDICAL CARE IF:  Your pain treatments are not helping.  You are having side effects from your medicines.  You are struggling with fatigue, mood changes, depression, or anxiety.   This information is not intended to replace advice given to you by your health care provider. Make sure you discuss any questions you have with your health care provider.   Document Released: 09/18/2003 Document Revised: 01/11/2014 Document Reviewed: 05/31/2013 [employer] Interactive Patient Education 2016 [employer]  
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
## 2015-04-22 DIAGNOSIS — H5213 Myopia, bilateral: Secondary | ICD-10-CM | POA: Diagnosis not present

## 2015-04-22 DIAGNOSIS — H52223 Regular astigmatism, bilateral: Secondary | ICD-10-CM | POA: Diagnosis not present

## 2015-04-23 DIAGNOSIS — M79675 Pain in left toe(s): Secondary | ICD-10-CM | POA: Diagnosis not present

## 2015-04-23 DIAGNOSIS — R202 Paresthesia of skin: Secondary | ICD-10-CM | POA: Diagnosis not present

## 2015-04-23 DIAGNOSIS — M79674 Pain in right toe(s): Secondary | ICD-10-CM | POA: Diagnosis not present

## 2015-08-22 ENCOUNTER — Encounter: Payer: BLUE CROSS/BLUE SHIELD | Admitting: Family

## 2016-08-20 ENCOUNTER — Other Ambulatory Visit: Payer: Self-pay | Admitting: Family

## 2016-08-20 ENCOUNTER — Encounter: Payer: Self-pay | Admitting: Family

## 2016-08-20 ENCOUNTER — Ambulatory Visit (INDEPENDENT_AMBULATORY_CARE_PROVIDER_SITE_OTHER): Payer: BLUE CROSS/BLUE SHIELD | Admitting: Family

## 2016-08-20 VITALS — BP 113/72 | HR 71 | Temp 97.8°F | Ht 66.0 in | Wt 196.6 lb

## 2016-08-20 DIAGNOSIS — R197 Diarrhea, unspecified: Secondary | ICD-10-CM | POA: Diagnosis not present

## 2016-08-20 DIAGNOSIS — N898 Other specified noninflammatory disorders of vagina: Secondary | ICD-10-CM

## 2016-08-20 DIAGNOSIS — E669 Obesity, unspecified: Secondary | ICD-10-CM | POA: Diagnosis not present

## 2016-08-20 DIAGNOSIS — L732 Hidradenitis suppurativa: Secondary | ICD-10-CM | POA: Diagnosis not present

## 2016-08-20 DIAGNOSIS — K580 Irritable bowel syndrome with diarrhea: Secondary | ICD-10-CM

## 2016-08-20 DIAGNOSIS — Z0000 Encounter for general adult medical examination without abnormal findings: Secondary | ICD-10-CM

## 2016-08-20 DIAGNOSIS — Z23 Encounter for immunization: Secondary | ICD-10-CM | POA: Diagnosis not present

## 2016-08-20 DIAGNOSIS — Z01419 Encounter for gynecological examination (general) (routine) without abnormal findings: Secondary | ICD-10-CM

## 2016-08-20 LAB — WET PREP FOR TRICH, YEAST, CLUE
CLUE CELL EXAM: POSITIVE — AB
Trichomonas Exam: NEGATIVE
Yeast Exam: NEGATIVE

## 2016-08-20 LAB — MICROSCOPIC EXAMINATION: RENAL EPITHEL UA: NONE SEEN /HPF

## 2016-08-20 LAB — URINALYSIS, COMPLETE
Bilirubin, UA: NEGATIVE
Glucose, UA: NEGATIVE
KETONES UA: NEGATIVE
LEUKOCYTES UA: NEGATIVE
Nitrite, UA: NEGATIVE
PH UA: 5.5 (ref 5.0–7.5)
PROTEIN UA: NEGATIVE
Specific Gravity, UA: 1.01 (ref 1.005–1.030)
Urobilinogen, Ur: 0.2 mg/dL (ref 0.2–1.0)

## 2016-08-20 MED ORDER — DICYCLOMINE HCL 20 MG PO TABS: 20.0000 mg | ORAL_TABLET | Freq: Three times a day (TID) | ORAL | 6 refills | Status: DC

## 2016-08-20 MED ORDER — METRONIDAZOLE 500 MG PO TABS: 500.0000 mg | ORAL_TABLET | Freq: Two times a day (BID) | ORAL | 0 refills | Status: DC

## 2016-08-20 MED ORDER — CLINDAMYCIN PHOSPHATE 1 % EX SOLN: Freq: Two times a day (BID) | CUTANEOUS | 3 refills | Status: DC

## 2016-08-20 NOTE — Patient Instructions (Addendum)
Hidradenitis Suppurativa Hidradenitis suppurativa is a long-term (chronic) skin disease that starts with blocked sweat glands or hair follicles. Bacteria may grow in these blocked openings of your skin. Hidradenitis suppurativa is like a severe form of acne that develops in areas of your body where acne would be unusual. It is most likely to affect the areas of your body where skin rubs against skin and becomes moist. This includes your:  Underarms.  Groin.  Genital areas.  Buttocks.  Upper thighs.  Breasts.  Hidradenitis suppurativa may start out with small pimples. The pimples can develop into deep sores that break open (rupture) and drain pus. Over time your skin may thicken and become scarred. Hidradenitis suppurativa cannot be passed from person to person. What are the causes? The exact cause of hidradenitis suppurativa is not known. This condition may be due to:  Female and female hormones. The condition is rare before and after puberty.  An overactive body defense system (immune system). Your immune system may overreact to the blocked hair follicles or sweat glands and cause swelling and pus-filled sores.  What increases the risk? You may have a higher risk of hidradenitis suppurativa if you:  Are a woman.  Are between ages 11 and 55.  Have a family history of hidradenitis suppurativa.  Have a personal history of acne.  Are overweight.  Smoke.  Take the drug lithium.  What are the signs or symptoms? The first signs of an outbreak are usually painful skin bumps that look like pimples. As the condition progresses:  Skin bumps may get bigger and grow deeper into the skin.  Bumps under the skin may rupture and drain smelly pus.  Skin may become itchy and infected.  Skin may thicken and scar.  Drainage may continue through tunnels under the skin (fistulas).  Walking and moving your arms can become painful.  How is this diagnosed? Your health care provider may  diagnose hidradenitis suppurativa based on your medical history and your signs and symptoms. A physical exam will also be done. You may need to see a health care provider who specializes in skin diseases (dermatologist). You may also have tests done to confirm the diagnosis. These can include:  Swabbing a sample of pus or drainage from your skin so it can be sent to the lab and tested for infection.  Blood tests to check for infection.  How is this treated? The same treatment will not work for everybody with hidradenitis suppurativa. Your treatment will depend on how severe your symptoms are. You may need to try several treatments to find what works best for you. Part of your treatment may include cleaning and bandaging (dressing) your wounds. You may also have to take medicines, such as the following:  Antibiotics.  Acne medicines.  Medicines to block or suppress the immune system.  A diabetes medicine (metformin) is sometimes used to treat this condition.  For women, birth control pills can sometimes help relieve symptoms.  You may need surgery if you have a severe case of hidradenitis suppurativa that does not respond to medicine. Surgery may involve:  Using a laser to clear the skin and remove hair follicles.  Opening and draining deep sores.  Removing the areas of skin that are diseased and scarred.  Follow these instructions at home:  Learn as much as you can about your disease, and work closely with your health care providers.  Take medicines only as directed by your health care provider.  If you were prescribed   an antibiotic medicine, finish it all even if you start to feel better.  If you are overweight, losing weight may be very helpful. Try to reach and maintain a healthy weight.  Do not use any tobacco products, including cigarettes, chewing tobacco, or electronic cigarettes. If you need help quitting, ask your health care provider.  Do not shave the areas where you  get hidradenitis suppurativa.  Do not wear deodorant.  Wear loose-fitting clothes.  Try not to overheat and get sweaty.  Take a daily bleach bath as directed by your health care provider. ? Fill your bathtub halfway with water. ? Pour in  cup of unscented household bleach. ? Soak for 5-10 minutes.  Cover sore areas with a warm, clean washcloth (compress) for 5-10 minutes. Contact a health care provider if:  You have a flare-up of hidradenitis suppurativa.  You have chills or a fever.  You are having trouble controlling your symptoms at home. This information is not intended to replace advice given to you by your health care provider. Make sure you discuss any questions you have with your health care provider. Document Released: 08/05/2003 Document Revised: 05/29/2015 Document Reviewed: 03/23/2013 Elsevier Interactive Patient Education  2018 [employer]  Irritable Bowel Syndrome, Adult Irritable bowel syndrome (IBS) is not one specific disease. It is a group of symptoms that affects the organs responsible for digestion (gastrointestinal or GI tract). To regulate how your GI tract works, your body sends signals back and forth between your intestines and your brain. If you have IBS, there may be a problem with these signals. As a result, your GI tract does not function normally. Your intestines may become more sensitive and overreact to certain things. This is especially true when you eat certain foods or when you are under stress. There are four types of IBS. These may be determined based on the consistency of your stool:  IBS with diarrhea.  IBS with constipation.  Mixed IBS.  Unsubtyped IBS.  It is important to know which type of IBS you have. Some treatments are more likely to be helpful for certain types of IBS. What are the causes? The exact cause of IBS is not known. What increases the risk? You may have a higher risk of IBS if:  You are a woman.  You are younger  than 36 years old.  You have a family history of IBS.  You have mental health problems.  You have had bacterial infection of your GI tract.  What are the signs or symptoms? Symptoms of IBS vary from person to person. The main symptom is abdominal pain or discomfort. Additional symptoms usually include one or more of the following:  Diarrhea, constipation, or both.  Abdominal swelling or bloating.  Feeling full or sick after eating a small or regular-size meal.  Frequent gas.  Mucus in the stool.  A feeling of having more stool left after a bowel movement.  Symptoms tend to come and go. They may be associated with stress, psychiatric conditions, or nothing at all. How is this diagnosed? There is no specific test to diagnose IBS. Your health care provider will make a diagnosis based on a physical exam, medical history, and your symptoms. You may have other tests to rule out other conditions that may be causing your symptoms. These may include:  Blood tests.  X-rays.  CT scan.  Endoscopy and colonoscopy. This is a test in which your GI tract is viewed with a long, thin, flexible tube.  How is this treated? There is no cure for IBS, but treatment can help relieve symptoms. IBS treatment often includes:  Changes to your diet, such as: ? Eating more fiber. ? Avoiding foods that cause symptoms. ? Drinking more water. ? Eating regular, medium-sized portioned meals.  Medicines. These may include: ? Fiber supplements if you have constipation. ? Medicine to control diarrhea (antidiarrheal medicines). ? Medicine to help control muscle spasms in your GI tract (antispasmodic medicines). ? Medicines to help with any mental health issues, such as antidepressants or tranquilizers.  Therapy. ? Talk therapy may help with anxiety, depression, or other mental health issues that can make IBS symptoms worse.  Stress reduction. ? Managing your stress can help keep symptoms under  control.  Follow these instructions at home:  Take medicines only as directed by your health care provider.  Eat a healthy diet. ? Avoid foods and drinks with added sugar. ? Include more whole grains, fruits, and vegetables gradually into your diet. This may be especially helpful if you have IBS with constipation. ? Avoid any foods and drinks that make your symptoms worse. These may include dairy products and caffeinated or carbonated drinks. ? Do not eat large meals. ? Drink enough fluid to keep your urine clear or pale yellow.  Exercise regularly. Ask your health care provider for recommendations of good activities for you.  Keep all follow-up visits as directed by your health care provider. This is important. Contact a health care provider if:  You have constant pain.  You have trouble or pain with swallowing.  You have worsening diarrhea. Get help right away if:  You have severe and worsening abdominal pain.  You have diarrhea and: ? You have a rash, stiff neck, or severe headache. ? You are irritable, sleepy, or difficult to awaken. ? You are weak, dizzy, or extremely thirsty.  You have bright red blood in your stool or you have black tarry stools.  You have unusual abdominal swelling that is painful.  You vomit continuously.  You vomit blood (hematemesis).  You have both abdominal pain and a fever. This information is not intended to replace advice given to you by your health care provider. Make sure you discuss any questions you have with your health care provider. Document Released: 12/21/2004 Document Revised: 05/23/2015 Document Reviewed: 09/07/2013 Elsevier Interactive Patient Education  2018 [employer].

## 2016-08-20 NOTE — Addendum Note (Signed)
Addended by: Almeta Monas on: 08/20/2016 11:05 AM   Modules accepted: Orders

## 2016-08-20 NOTE — Progress Notes (Signed)
Subjective:    Patient ID: Jacqueline Andrade, female    DOB: November 04, 1980, 36 y.o.   MRN: [id number]  Pt presents to the office today for CPE with pap. Pt requesting dermatologists referral today for  hidradenitis suppurativa. States she has had this for years and gets abscess under axially and groin that will last months.  Gynecologic Exam  The patient's primary symptoms include a genital odor and vaginal discharge. The patient's pertinent negatives include no genital itching. The patient is experiencing no pain. Associated symptoms include abdominal pain and diarrhea. Pertinent negatives include no vomiting. The vaginal discharge was watery and white. She is sexually active.  Diarrhea   This is a chronic problem. The current episode started more than 1 year ago. The problem occurs less than 2 times per day. The problem has been waxing and waning. The patient states that diarrhea does not awaken her from sleep. Associated symptoms include abdominal pain, bloating and increased flatus. Pertinent negatives include no vomiting. Exacerbated by: eating. She has tried change of diet for the symptoms. The treatment provided mild relief.      Review of Systems  Gastrointestinal: Positive for abdominal pain, bloating, diarrhea and flatus. Negative for vomiting.  Genitourinary: Positive for vaginal discharge.  All other systems reviewed and are negative.      Objective:   Physical Exam  Constitutional: She is oriented to person, place, and time. She appears well-developed and well-nourished. No distress.  HENT:  Head: Normocephalic and atraumatic.  Right Ear: External ear normal.  Left Ear: External ear normal.  Nose: Nose normal.  Mouth/Throat: Oropharynx is clear and moist.  Eyes: Pupils are equal, round, and reactive to light.  Neck: Normal range of motion. Neck supple. No thyromegaly present.  Cardiovascular: Normal rate, regular rhythm, normal heart sounds and intact distal pulses.   No  murmur heard. Pulmonary/Chest: Effort normal and breath sounds normal. No respiratory distress. She has no wheezes. Right breast exhibits no inverted nipple, no mass, no nipple discharge, no skin change and no tenderness. Left breast exhibits no inverted nipple, no mass, no nipple discharge, no skin change and no tenderness. Breasts are symmetrical.  Abdominal: Soft. Bowel sounds are normal. She exhibits no distension. There is no tenderness.  Genitourinary: Vagina normal.  Genitourinary Comments: Bimanual exam- no adnexal masses or tenderness, ovaries nonpalpable   Cervix parous and pink- No discharge   Musculoskeletal: Normal range of motion. She exhibits no edema or tenderness.  Neurological: She is alert and oriented to person, place, and time. She has normal reflexes. No cranial nerve deficit.  Skin: Skin is warm and dry.  Psychiatric: She has a normal mood and affect. Her behavior is normal. Judgment and thought content normal.  Vitals reviewed.     BP 113/72   Pulse 71   Temp 97.8 F (36.6 C) (Oral)   Ht '5\' 6"'  (1.676 m)   Wt 196 lb 9.6 oz (89.2 kg)   LMP 08/07/2016   BMI 31.73 kg/m      Assessment & Plan:  1. Gynecologic exam normal - Urinalysis, Complete - CBC with Differential/Platelet - CMP14+EGFR - Lipid panel - Pap IG w/ reflex to HPV when ASC-U  2. Hidradenitis suppurativa Started pt on Clindamycin topical BID Warm compresses Do not pick or squeeze  - CBC with Differential/Platelet - CMP14+EGFR - Lipid panel - clindamycin (CLEOCIN-T) 1 % external solution; Apply topically 2 (two) times daily.  Dispense: 100 mL; Refill: 3 - Ambulatory referral to Dermatology  3.  Diarrhea, unspecified type - CBC with Differential/Platelet - CMP14+EGFR - Lipid panel - dicyclomine (BENTYL) 20 MG tablet; Take 1 tablet (20 mg total) by mouth 4 (four) times daily -  before meals and at bedtime.  Dispense: 120 tablet; Refill: 6  4. Irritable bowel syndrome with diarrhea Start  on daily fiber Started on Bentyl today Avoid large meals, fatty or spicy foods - CBC with Differential/Platelet - CMP14+EGFR - Lipid panel - dicyclomine (BENTYL) 20 MG tablet; Take 1 tablet (20 mg total) by mouth 4 (four) times daily -  before meals and at bedtime.  Dispense: 120 tablet; Refill: 6  5. Vaginal discharge - CBC with Differential/Platelet - CMP14+EGFR - Lipid panel - WET PREP FOR TRICH, YEAST, CLUE  6. Obesity (BMI 30-39.9) - CBC with Differential/Platelet - CMP14+EGFR - Lipid panel  7. Annual physical exam - CBC with Differential/Platelet - CMP14+EGFR - Lipid panel - VITAMIN D 25 Hydroxy (Vit-D Deficiency, Fractures) - TSH - Pap IG w/ reflex to HPV when ASC-U - WET PREP FOR TRICH, YEAST, CLUE   Continue all meds Labs pending Health Maintenance reviewed Diet and exercise encouraged RTO 6 months   Evelina Dun, FNP

## 2016-08-21 LAB — CMP14+EGFR
ALT: 9 IU/L (ref 0–32)
AST: 11 IU/L (ref 0–40)
Albumin/Globulin Ratio: 1.3 (ref 1.2–2.2)
Albumin: 4.1 g/dL (ref 3.5–5.5)
Alkaline Phosphatase: 78 IU/L (ref 39–117)
BUN/Creatinine Ratio: 15 (ref 9–23)
BUN: 9 mg/dL (ref 6–20)
Bilirubin Total: 0.3 mg/dL (ref 0.0–1.2)
CO2: 23 mmol/L (ref 20–29)
Calcium: 9 mg/dL (ref 8.7–10.2)
Chloride: 103 mmol/L (ref 96–106)
Creatinine, Ser: 0.61 mg/dL (ref 0.57–1.00)
GFR calc Af Amer: 135 mL/min/1.73 (ref >59)
GFR calc non Af Amer: 117 mL/min/1.73 (ref >59)
Globulin, Total: 3.1 g/dL (ref 1.5–4.5)
Glucose: 92 mg/dL (ref 65–99)
Potassium: 3.8 mmol/L (ref 3.5–5.2)
Sodium: 139 mmol/L (ref 134–144)
Total Protein: 7.2 g/dL (ref 6.0–8.5)

## 2016-08-21 LAB — CBC WITH DIFFERENTIAL/PLATELET
Basophils Absolute: 0 x10E3/uL (ref 0.0–0.2)
Basos: 0 %
EOS (ABSOLUTE): 0 x10E3/uL (ref 0.0–0.4)
Eos: 1 %
Hematocrit: 36.2 % (ref 34.0–46.6)
Hemoglobin: 11.8 g/dL (ref 11.1–15.9)
Immature Grans (Abs): 0 x10E3/uL (ref 0.0–0.1)
Immature Granulocytes: 0 %
Lymphocytes Absolute: 2.3 x10E3/uL (ref 0.7–3.1)
Lymphs: 32 %
MCH: 27.6 pg (ref 26.6–33.0)
MCHC: 32.6 g/dL (ref 31.5–35.7)
MCV: 85 fL (ref 79–97)
Monocytes Absolute: 0.4 x10E3/uL (ref 0.1–0.9)
Monocytes: 6 %
Neutrophils Absolute: 4.3 x10E3/uL (ref 1.4–7.0)
Neutrophils: 61 %
Platelets: 280 x10E3/uL (ref 150–379)
RBC: 4.28 x10E6/uL (ref 3.77–5.28)
RDW: 14.6 % (ref 12.3–15.4)
WBC: 7.1 x10E3/uL (ref 3.4–10.8)

## 2016-08-21 LAB — VITAMIN D 25 HYDROXY (VIT D DEFICIENCY, FRACTURES): Vit D, 25-Hydroxy: 18.4 ng/mL — ABNORMAL LOW (ref 30.0–100.0)

## 2016-08-21 LAB — LIPID PANEL
Chol/HDL Ratio: 5.4 ratio — ABNORMAL HIGH (ref 0.0–4.4)
Cholesterol, Total: 174 mg/dL (ref 100–199)
HDL: 32 mg/dL — ABNORMAL LOW (ref >39)
LDL Calculated: 99 mg/dL (ref 0–99)
Triglycerides: 213 mg/dL — ABNORMAL HIGH (ref 0–149)
VLDL Cholesterol Cal: 43 mg/dL — ABNORMAL HIGH (ref 5–40)

## 2016-08-21 LAB — TSH: TSH: 2.21 u[IU]/mL (ref 0.450–4.500)

## 2016-08-24 ENCOUNTER — Other Ambulatory Visit: Payer: Self-pay | Admitting: Family

## 2016-08-24 LAB — PAP IG W/ RFLX HPV ASCU: PAP Smear Comment: 0

## 2016-08-24 MED ORDER — VITAMIN D (ERGOCALCIFEROL) 1.25 MG (50000 UNIT) PO CAPS: 50000.0000 [IU] | ORAL_CAPSULE | ORAL | 3 refills | Status: DC

## 2016-09-30 DIAGNOSIS — L732 Hidradenitis suppurativa: Secondary | ICD-10-CM | POA: Diagnosis not present

## 2017-03-14 ENCOUNTER — Ambulatory Visit (INDEPENDENT_AMBULATORY_CARE_PROVIDER_SITE_OTHER): Payer: BLUE CROSS/BLUE SHIELD | Admitting: Family

## 2017-03-14 ENCOUNTER — Ambulatory Visit (INDEPENDENT_AMBULATORY_CARE_PROVIDER_SITE_OTHER): Payer: BLUE CROSS/BLUE SHIELD

## 2017-03-14 ENCOUNTER — Encounter: Payer: Self-pay | Admitting: Family

## 2017-03-14 VITALS — BP 137/99 | HR 66 | Temp 97.9°F | Ht 66.0 in | Wt 195.6 lb

## 2017-03-14 DIAGNOSIS — G8929 Other chronic pain: Secondary | ICD-10-CM

## 2017-03-14 DIAGNOSIS — M25571 Pain in right ankle and joints of right foot: Secondary | ICD-10-CM

## 2017-03-14 DIAGNOSIS — M19071 Primary osteoarthritis, right ankle and foot: Secondary | ICD-10-CM

## 2017-03-14 DIAGNOSIS — Z981 Arthrodesis status: Secondary | ICD-10-CM | POA: Diagnosis not present

## 2017-03-14 DIAGNOSIS — M25579 Pain in unspecified ankle and joints of unspecified foot: Secondary | ICD-10-CM

## 2017-03-14 NOTE — Patient Instructions (Signed)
Ankle Pain Many things can cause ankle pain, including an injury to the area and overuse of the ankle.The ankle joint holds your body weight and allows you to move around. Ankle pain can occur on either side or the back of one ankle or both ankles. Ankle pain may be sharp and burning or dull and aching. There may be tenderness, stiffness, redness, or warmth around the ankle. Follow these instructions at home: Activity  Rest your ankle as told by your health care provider. Avoid any activities that cause ankle pain.  Do exercises as told by your health care provider.  Ask your health care provider if you can drive. Using a brace, a bandage, or crutches  If you were given a brace: ? Wear it as told by your health care provider. ? Remove it when you take a bath or a shower. ? Try not to move your ankle very much, but wiggle your toes from time to time. This helps to prevent swelling.  If you were given an elastic bandage: ? Remove it when you take a bath or a shower. ? Try not to move your ankle very much, but wiggle your toes from time to time. This helps to prevent swelling. ? Adjust the bandage to make it more comfortable if it feels too tight. ? Loosen the bandage if you have numbness or tingling in your foot or if your foot turns cold and blue.  If you have crutches, use them as told by your health care provider. Continue to use them until you can walk without feeling pain in your ankle. Managing pain, stiffness, and swelling  Raise (elevate) your ankle above the level of your heart while you are sitting or lying down.  If directed, apply ice to the area: ? Put ice in a plastic bag. ? Place a towel between your skin and the bag. ? Leave the ice on for 20 minutes, 2-3 times per day. General instructions  Keep all follow-up visits as told by your health care provider. This is important.  Record this information that may be helpful for you and your health care provider: ? How  often you have ankle pain. ? Where the pain is located. ? What the pain feels like.  Take over-the-counter and prescription medicines only as told by your health care provider. Contact a health care provider if:  Your pain gets worse.  Your pain is not relieved with medicines.  You have a fever or chills.  You are having more trouble with walking.  You have new symptoms. Get help right away if:  Your foot, leg, toes, or ankle tingles or becomes numb.  Your foot, leg, toes, or ankle becomes swollen.  Your foot, leg, toes, or ankle turns pale or blue. This information is not intended to replace advice given to you by your health care provider. Make sure you discuss any questions you have with your health care provider. Document Released: 06/10/2009 Document Revised: 08/22/2015 Document Reviewed: 07/23/2014 Elsevier Interactive Patient Education  2018 Else[employer] Inc.  
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
## 2017-07-05 ENCOUNTER — Encounter: Payer: Self-pay | Admitting: Family

## 2017-07-05 ENCOUNTER — Ambulatory Visit (INDEPENDENT_AMBULATORY_CARE_PROVIDER_SITE_OTHER): Payer: BLUE CROSS/BLUE SHIELD | Admitting: Family

## 2017-07-05 VITALS — BP 126/84 | HR 80 | Temp 98.2°F | Ht 66.0 in | Wt 191.8 lb

## 2017-07-05 DIAGNOSIS — J029 Acute pharyngitis, unspecified: Secondary | ICD-10-CM | POA: Diagnosis not present

## 2017-07-05 DIAGNOSIS — J020 Streptococcal pharyngitis: Secondary | ICD-10-CM

## 2017-07-05 LAB — RAPID STREP SCREEN (MED CTR MEBANE ONLY): Strep Gp A Ag, IA W/Reflex: POSITIVE — AB

## 2017-07-05 MED ORDER — AMOXICILLIN 500 MG PO CAPS: 500.0000 mg | ORAL_CAPSULE | Freq: Two times a day (BID) | ORAL | 0 refills | Status: DC

## 2017-07-05 NOTE — Progress Notes (Signed)
   Subjective:    Patient ID: Jacqueline Andrade, female    DOB: [birth year], 37 y.o.   MRN: [id number]  Chief Complaint  Patient presents with  . Sore Throat  . Ear Pain    Sore Throat   This is a new problem. The current episode started in the past 7 days. The problem has been gradually worsening. There has been no fever. The pain is at a severity of 5/10. The pain is moderate. Associated symptoms include congestion, coughing, ear pain, headaches, a hoarse voice, swollen glands and trouble swallowing. She has tried acetaminophen, gargles and NSAIDs for the symptoms. The treatment provided mild relief.      Review of Systems  HENT: Positive for congestion, ear pain, hoarse voice and trouble swallowing.   Respiratory: Positive for cough.   Neurological: Positive for headaches.  All other systems reviewed and are negative.      Objective:   Physical Exam  Constitutional: She is oriented to person, place, and time. She appears well-developed and well-nourished. No distress.  HENT:  Head: Normocephalic and atraumatic.  Right Ear: External ear normal. A middle ear effusion is present.  Mouth/Throat: Posterior oropharyngeal edema and posterior oropharyngeal erythema present.  Eyes: Pupils are equal, round, and reactive to light.  Neck: Normal range of motion. Neck supple. No thyromegaly present.  Cardiovascular: Normal rate, regular rhythm, normal heart sounds and intact distal pulses.  No murmur heard. Pulmonary/Chest: Effort normal and breath sounds normal. No respiratory distress. She has no wheezes.  Intermittent productive cough   Abdominal: Soft. Bowel sounds are normal. She exhibits no distension. There is no tenderness.  Musculoskeletal: Normal range of motion. She exhibits no edema or tenderness.  Neurological: She is alert and oriented to person, place, and time. She has normal reflexes. No cranial nerve deficit.  Skin: Skin is warm and dry.  Psychiatric: She has a normal  mood and affect. Her behavior is normal. Judgment and thought content normal.  Vitals reviewed.     BP 126/84   Pulse 80   Temp 98.2 F (36.8 C) (Oral)   Ht 5\' 6"  (1.676 m)   Wt 191 lb 12.8 oz (87 kg)   BMI 30.96 kg/m      Assessment & Plan:  Desma Maximntonia was seen today for sore throat and ear pain.  Diagnoses and all orders for this visit:  Sore throat -     Rapid Strep Screen ([facility])  Strep throat -     amoxicillin (AMOXIL) 500 MG capsule; Take 1 capsule (500 mg total) by mouth 2 (two) times daily.   - Take meds as prescribed - Use a cool mist humidifier  -Use saline nose sprays frequently -Force fluids -For any cough or congestion  Use plain Mucinex- regular strength or max strength is fine -For fever or aces or pains- take tylenol or ibuprofen. -Throat lozenges if help -New toothbrush in 3 days   Jannifer Rodneyhristy Staceyann Knouff, FNP

## 2017-07-05 NOTE — Patient Instructions (Signed)
Strep Throat Strep throat is a bacterial infection of the throat. Your health care provider may call the infection tonsillitis or pharyngitis, depending on whether there is swelling in the tonsils or at the back of the throat. Strep throat is most common during the cold months of the year in children who are 5-37 years of age, but it can happen during any season in people of any age. This infection is spread from person to person (contagious) through coughing, sneezing, or close contact. What are the causes? Strep throat is caused by the bacteria called Streptococcus pyogenes. What increases the risk? This condition is more likely to develop in:  People who spend time in crowded places where the infection can spread easily.  People who have close contact with someone who has strep throat.  What are the signs or symptoms? Symptoms of this condition include:  Fever or chills.  Redness, swelling, or pain in the tonsils or throat.  Pain or difficulty when swallowing.  White or yellow spots on the tonsils or throat.  Swollen, tender glands in the neck or under the jaw.  Red rash all over the body (rare).  How is this diagnosed? This condition is diagnosed by performing a rapid strep test or by taking a swab of your throat (throat culture test). Results from a rapid strep test are usually ready in a few minutes, but throat culture test results are available after one or two days. How is this treated? This condition is treated with antibiotic medicine. Follow these instructions at home: Medicines  Take over-the-counter and prescription medicines only as told by your health care provider.  Take your antibiotic as told by your health care provider. Do not stop taking the antibiotic even if you start to feel better.  Have family members who also have a sore throat or fever tested for strep throat. They may need antibiotics if they have the strep infection. Eating and drinking  Do not  share food, drinking cups, or personal items that could cause the infection to spread to other people.  If swallowing is difficult, try eating soft foods until your sore throat feels better.  Drink enough fluid to keep your urine clear or pale yellow. General instructions  Gargle with a salt-water mixture 3-4 times per day or as needed. To make a salt-water mixture, completely dissolve -1 tsp of salt in 1 cup of warm water.  Make sure that all household members wash their hands well.  Get plenty of rest.  Stay home from school or work until you have been taking antibiotics for 24 hours.  Keep all follow-up visits as told by your health care provider. This is important. Contact a health care provider if:  The glands in your neck continue to get bigger.  You develop a rash, cough, or earache.  You cough up a thick liquid that is green, yellow-brown, or bloody.  You have pain or discomfort that does not get better with medicine.  Your problems seem to be getting worse rather than better.  You have a fever. Get help right away if:  You have new symptoms, such as vomiting, severe headache, stiff or painful neck, chest pain, or shortness of breath.  You have severe throat pain, drooling, or changes in your voice.  You have swelling of the neck, or the skin on the neck becomes red and tender.  You have signs of dehydration, such as fatigue, dry mouth, and decreased urination.  You become increasingly sleepy, or   you cannot wake up completely.  Your joints become red or painful. This information is not intended to replace advice given to you by your health care provider. Make sure you discuss any questions you have with your health care provider. Document Released: 12/19/1999 Document Revised: 08/20/2015 Document Reviewed: 04/15/2014 Elsevier Interactive Patient Education  2018 [employer] Inc[employer]  

## 2017-09-09 ENCOUNTER — Encounter: Payer: Self-pay | Admitting: Family

## 2017-09-09 ENCOUNTER — Ambulatory Visit (INDEPENDENT_AMBULATORY_CARE_PROVIDER_SITE_OTHER): Payer: BLUE CROSS/BLUE SHIELD | Admitting: Family

## 2017-09-09 VITALS — BP 134/92 | HR 50 | Temp 98.0°F | Ht 66.0 in | Wt 190.4 lb

## 2017-09-09 DIAGNOSIS — E669 Obesity, unspecified: Secondary | ICD-10-CM

## 2017-09-09 DIAGNOSIS — K580 Irritable bowel syndrome with diarrhea: Secondary | ICD-10-CM | POA: Diagnosis not present

## 2017-09-09 DIAGNOSIS — Z01411 Encounter for gynecological examination (general) (routine) with abnormal findings: Secondary | ICD-10-CM | POA: Diagnosis not present

## 2017-09-09 DIAGNOSIS — Z01419 Encounter for gynecological examination (general) (routine) without abnormal findings: Secondary | ICD-10-CM | POA: Diagnosis not present

## 2017-09-09 DIAGNOSIS — Z0000 Encounter for general adult medical examination without abnormal findings: Secondary | ICD-10-CM

## 2017-09-09 DIAGNOSIS — M25571 Pain in right ankle and joints of right foot: Secondary | ICD-10-CM

## 2017-09-09 DIAGNOSIS — L732 Hidradenitis suppurativa: Secondary | ICD-10-CM

## 2017-09-09 DIAGNOSIS — N898 Other specified noninflammatory disorders of vagina: Secondary | ICD-10-CM

## 2017-09-09 DIAGNOSIS — R197 Diarrhea, unspecified: Secondary | ICD-10-CM

## 2017-09-09 DIAGNOSIS — G8929 Other chronic pain: Secondary | ICD-10-CM

## 2017-09-09 LAB — WET PREP FOR TRICH, YEAST, CLUE
Clue Cell Exam: NEGATIVE
Trichomonas Exam: NEGATIVE
Yeast Exam: NEGATIVE

## 2017-09-09 MED ORDER — SPIRONOLACTONE 50 MG PO TABS: 50.0000 mg | ORAL_TABLET | Freq: Every day | ORAL | 3 refills | Status: DC

## 2017-09-09 MED ORDER — SULFAMETHOXAZOLE-TRIMETHOPRIM 800-160 MG PO TABS: 1.0000 | ORAL_TABLET | Freq: Two times a day (BID) | ORAL | 2 refills | Status: DC

## 2017-09-09 MED ORDER — DICYCLOMINE HCL 20 MG PO TABS: 20.0000 mg | ORAL_TABLET | Freq: Three times a day (TID) | ORAL | 6 refills | Status: DC

## 2017-09-09 MED ORDER — CLINDAMYCIN PHOSPHATE 1 % EX SOLN: Freq: Two times a day (BID) | CUTANEOUS | 3 refills | Status: DC

## 2017-09-09 NOTE — Patient Instructions (Signed)
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
## 2017-09-10 LAB — CMP14+EGFR
A/G RATIO: 1.3 (ref 1.2–2.2)
ALT: 29 IU/L (ref 0–32)
AST: 23 IU/L (ref 0–40)
Albumin: 4.3 g/dL (ref 3.5–5.5)
Alkaline Phosphatase: 84 IU/L (ref 39–117)
BUN / CREAT RATIO: 14 (ref 9–23)
BUN: 10 mg/dL (ref 6–20)
CHLORIDE: 103 mmol/L (ref 96–106)
CO2: 18 mmol/L — ABNORMAL LOW (ref 20–29)
Calcium: 9.7 mg/dL (ref 8.7–10.2)
Creatinine, Ser: 0.71 mg/dL (ref 0.57–1.00)
GFR calc Af Amer: 126 mL/min/{1.73_m2} (ref >59)
GFR calc non Af Amer: 109 mL/min/{1.73_m2} (ref >59)
GLOBULIN, TOTAL: 3.2 g/dL (ref 1.5–4.5)
Glucose: 86 mg/dL (ref 65–99)
POTASSIUM: 4.4 mmol/L (ref 3.5–5.2)
SODIUM: 140 mmol/L (ref 134–144)
Total Protein: 7.5 g/dL (ref 6.0–8.5)

## 2017-09-10 LAB — CBC WITH DIFFERENTIAL/PLATELET
BASOS ABS: 0 10*3/uL (ref 0.0–0.2)
BASOS: 0 %
EOS (ABSOLUTE): 0.1 10*3/uL (ref 0.0–0.4)
Eos: 1 %
Hematocrit: 37.8 % (ref 34.0–46.6)
Hemoglobin: 12.5 g/dL (ref 11.1–15.9)
IMMATURE GRANS (ABS): 0 10*3/uL (ref 0.0–0.1)
Immature Granulocytes: 0 %
LYMPHS ABS: 2.2 10*3/uL (ref 0.7–3.1)
LYMPHS: 30 %
MCH: 28.5 pg (ref 26.6–33.0)
MCHC: 33.1 g/dL (ref 31.5–35.7)
MCV: 86 fL (ref 79–97)
MONOS ABS: 0.6 10*3/uL (ref 0.1–0.9)
Monocytes: 8 %
NEUTROS ABS: 4.4 10*3/uL (ref 1.4–7.0)
Neutrophils: 61 %
PLATELETS: 267 10*3/uL (ref 150–450)
RBC: 4.39 x10E6/uL (ref 3.77–5.28)
RDW: 13.5 % (ref 12.3–15.4)
WBC: 7.3 10*3/uL (ref 3.4–10.8)

## 2017-09-10 LAB — LIPID PANEL
CHOLESTEROL TOTAL: 180 mg/dL (ref 100–199)
Chol/HDL Ratio: 5.3 ratio — ABNORMAL HIGH (ref 0.0–4.4)
HDL: 34 mg/dL — ABNORMAL LOW (ref >39)
LDL CALC: 110 mg/dL — AB (ref 0–99)
TRIGLYCERIDES: 180 mg/dL — AB (ref 0–149)
VLDL Cholesterol Cal: 36 mg/dL (ref 5–40)

## 2017-09-10 LAB — TSH: TSH: 2.98 u[IU]/mL (ref 0.450–4.500)

## 2017-09-13 LAB — IGP, CTNG, RFX APTIMA HPV ASCU
Chlamydia, Nuc. Acid Amp: NEGATIVE
GONOCOCCUS BY NUCLEIC ACID AMP: NEGATIVE
PAP SMEAR COMMENT: 0

## 2018-02-20 DIAGNOSIS — R509 Fever, unspecified: Secondary | ICD-10-CM | POA: Diagnosis not present

## 2018-02-20 DIAGNOSIS — Z6830 Body mass index (BMI) 30.0-30.9, adult: Secondary | ICD-10-CM | POA: Diagnosis not present

## 2018-02-20 DIAGNOSIS — J209 Acute bronchitis, unspecified: Secondary | ICD-10-CM | POA: Diagnosis not present

## 2018-02-20 DIAGNOSIS — R05 Cough: Secondary | ICD-10-CM | POA: Diagnosis not present

## 2018-06-29 DIAGNOSIS — G44219 Episodic tension-type headache, not intractable: Secondary | ICD-10-CM | POA: Diagnosis not present

## 2018-06-29 DIAGNOSIS — H5210 Myopia, unspecified eye: Secondary | ICD-10-CM | POA: Diagnosis not present

## 2018-06-29 DIAGNOSIS — H40033 Anatomical narrow angle, bilateral: Secondary | ICD-10-CM | POA: Diagnosis not present

## 2018-09-12 ENCOUNTER — Encounter: Payer: BLUE CROSS/BLUE SHIELD | Admitting: Family

## 2018-09-14 ENCOUNTER — Other Ambulatory Visit: Payer: Self-pay | Admitting: Family

## 2018-09-14 DIAGNOSIS — L732 Hidradenitis suppurativa: Secondary | ICD-10-CM

## 2018-10-02 ENCOUNTER — Encounter: Payer: Self-pay | Admitting: Family

## 2018-10-02 ENCOUNTER — Ambulatory Visit (INDEPENDENT_AMBULATORY_CARE_PROVIDER_SITE_OTHER): Payer: BC Managed Care – PPO | Admitting: Family

## 2018-10-02 ENCOUNTER — Other Ambulatory Visit: Payer: Self-pay

## 2018-10-02 VITALS — BP 124/85 | HR 73 | Temp 98.0°F | Ht 66.0 in | Wt 191.0 lb

## 2018-10-02 DIAGNOSIS — K580 Irritable bowel syndrome with diarrhea: Secondary | ICD-10-CM

## 2018-10-02 DIAGNOSIS — M7711 Lateral epicondylitis, right elbow: Secondary | ICD-10-CM

## 2018-10-02 DIAGNOSIS — Z0001 Encounter for general adult medical examination with abnormal findings: Secondary | ICD-10-CM | POA: Diagnosis not present

## 2018-10-02 DIAGNOSIS — M19071 Primary osteoarthritis, right ankle and foot: Secondary | ICD-10-CM

## 2018-10-02 DIAGNOSIS — M25521 Pain in right elbow: Secondary | ICD-10-CM

## 2018-10-02 DIAGNOSIS — R197 Diarrhea, unspecified: Secondary | ICD-10-CM

## 2018-10-02 DIAGNOSIS — Z0000 Encounter for general adult medical examination without abnormal findings: Secondary | ICD-10-CM | POA: Diagnosis not present

## 2018-10-02 DIAGNOSIS — E669 Obesity, unspecified: Secondary | ICD-10-CM

## 2018-10-02 DIAGNOSIS — L732 Hidradenitis suppurativa: Secondary | ICD-10-CM

## 2018-10-02 MED ORDER — SPIRONOLACTONE 50 MG PO TABS: 50.0000 mg | ORAL_TABLET | Freq: Every day | ORAL | 3 refills | Status: DC

## 2018-10-02 MED ORDER — DICYCLOMINE HCL 20 MG PO TABS: 20.0000 mg | ORAL_TABLET | Freq: Three times a day (TID) | ORAL | 6 refills | Status: DC

## 2018-10-02 MED ORDER — SULFAMETHOXAZOLE-TRIMETHOPRIM 800-160 MG PO TABS: 1.0000 | ORAL_TABLET | Freq: Two times a day (BID) | ORAL | 2 refills | Status: DC

## 2018-10-02 MED ORDER — PREDNISONE 10 MG (21) PO TBPK: ORAL_TABLET | ORAL | 0 refills | Status: DC

## 2018-10-02 MED ORDER — DICLOFENAC SODIUM 75 MG PO TBEC: 75.0000 mg | DELAYED_RELEASE_TABLET | Freq: Two times a day (BID) | ORAL | 1 refills | Status: DC

## 2018-10-02 NOTE — Progress Notes (Signed)
Subjective:    Patient ID: Jacqueline Andrade, female    DOB: [birth year], 38 y.o.   MRN: [id number]  Chief Complaint  Patient presents with  . Annual Exam   Jacqueline Andrade presents to the office today for CPE without pap. Jacqueline Andrade had pap 09/09/17. Jacqueline Andrade has hidradenitis suppurativa and takes spironolactone 50 mg daily and has a rx of bactrim as needed. States Jacqueline Andrade has about 6 abscess in her groin and hip that are draining a yellow discharge. Jacqueline Andrade reports one started draining during her shower today.   Jacqueline Andrade has IBS with diarrhea. Jacqueline Andrade takes the Bentyl BID that helps.  Arm Pain  The incident occurred more than 1 week ago. The injury mechanism was repetitive motion. The pain is present in the right elbow. The quality of the pain is described as aching. The pain is at a severity of 8/10 (when working, a 2 just sitting here). The pain is moderate. The pain has been constant since the incident. Pertinent negatives include no tingling. The symptoms are aggravated by movement. Jacqueline Andrade has tried NSAIDs and rest for the symptoms. The treatment provided mild relief.  Arthritis Presents for follow-up visit. Jacqueline Andrade complains of pain and stiffness. The symptoms have been stable. Affected locations include the right ankle. Her pain is at a severity of 4/10.      Review of Systems  Musculoskeletal: Positive for arthritis and stiffness.  Neurological: Negative for tingling.  All other systems reviewed and are negative.  Family History  Problem Relation Age of Onset  . Diabetes Mother   . Hypertension Mother   . Hyperlipidemia Mother   . Glaucoma Mother   . Diabetes Father   . Hyperlipidemia Father   . Glaucoma Father   . Glaucoma Other    Social History   Socioeconomic History  . Marital status: Married    Spouse name: Not on file  . Number of children: Not on file  . Years of education: Not on file  . Highest education level: Not on file  Occupational History  . Not on file  Social Needs  . Financial resource strain:  Not on file  . Food insecurity    Worry: Not on file    Inability: Not on file  . Transportation needs    Medical: Not on file    Non-medical: Not on file  Tobacco Use  . Smoking status: Never Smoker  . Smokeless tobacco: Never Used  Substance and Sexual Activity  . Alcohol use: Yes    Comment: rare  . Drug use: No  . Sexual activity: Yes    Birth control/protection: Surgical  Lifestyle  . Physical activity    Days per week: Not on file    Minutes per session: Not on file  . Stress: Not on file  Relationships  . Social Herbalist on phone: Not on file    Gets together: Not on file    Attends religious service: Not on file    Active member of club or organization: Not on file    Attends meetings of clubs or organizations: Not on file    Relationship status: Not on file  Other Topics Concern  . Not on file  Social History Narrative  . Not on file        Objective:   Physical Exam Vitals signs reviewed.  Constitutional:      General: Jacqueline Andrade is not in acute distress.    Appearance: Jacqueline Andrade is well-developed.  HENT:  Head: Normocephalic and atraumatic.     Right Ear: Tympanic membrane normal.     Left Ear: Tympanic membrane normal.  Eyes:     Pupils: Pupils are equal, round, and reactive to light.  Neck:     Musculoskeletal: Normal range of motion and neck supple.     Thyroid: No thyromegaly.  Cardiovascular:     Rate and Rhythm: Normal rate and regular rhythm.     Heart sounds: Normal heart sounds. No murmur.  Pulmonary:     Effort: Pulmonary effort is normal. No respiratory distress.     Breath sounds: Normal breath sounds. No wheezing.  Abdominal:     General: Bowel sounds are normal. There is no distension.     Palpations: Abdomen is soft.     Tenderness: There is no abdominal tenderness.  Musculoskeletal: Normal range of motion.        General: Tenderness present.     Comments: Tenderness in lateral elbow with flexion  Skin:    General: Skin is  warm and dry.     Findings: Abscess present.          Comments: Multiple abscess with erythemas and draining purulent discharge  Neurological:     Mental Status: Jacqueline Andrade is alert and oriented to person, place, and time.     Cranial Nerves: No cranial nerve deficit.     Deep Tendon Reflexes: Reflexes are normal and symmetric.  Psychiatric:        Behavior: Behavior normal.        Thought Content: Thought content normal.        Judgment: Judgment normal.       BP 124/85   Pulse 73   Temp 98 F (36.7 C) (Temporal)   Ht '5\' 6"'  (1.676 m)   Wt 191 lb (86.6 kg)   LMP 10/01/2018   SpO2 100%   BMI 30.83 kg/m      Assessment & Plan:  Jacqueline Andrade comes in today with chief complaint of Annual Exam   Diagnosis and orders addressed:  1. Annual physical exam - CMP14+EGFR - CBC with Differential/Platelet - Lipid panel - TSH  2. Obesity (BMI 30-39.9) - CMP14+EGFR - CBC with Differential/Platelet  3. Arthritis of ankle, right - CMP14+EGFR - CBC with Differential/Platelet  4. Right elbow pain - CMP14+EGFR - CBC with Differential/Platelet  5. Hidradenitis suppurativa Keep clean and dry Do not pick or squeeze - sulfamethoxazole-trimethoprim (BACTRIM DS) 800-160 MG tablet; Take 1 tablet by mouth 2 (two) times daily.  Dispense: 30 tablet; Refill: 2 - spironolactone (ALDACTONE) 50 MG tablet; Take 1 tablet (50 mg total) by mouth daily.  Dispense: 90 tablet; Refill: 3 - CMP14+EGFR - CBC with Differential/Platelet  6. Diarrhea, unspecified type - dicyclomine (BENTYL) 20 MG tablet; Take 1 tablet (20 mg total) by mouth 4 (four) times daily -  before meals and at bedtime.  Dispense: 120 tablet; Refill: 6 - CMP14+EGFR - CBC with Differential/Platelet  7. Irritable bowel syndrome with diarrhea - dicyclomine (BENTYL) 20 MG tablet; Take 1 tablet (20 mg total) by mouth 4 (four) times daily -  before meals and at bedtime.  Dispense: 120 tablet; Refill: 6 - CMP14+EGFR - CBC with  Differential/Platelet  8. Lateral epicondylitis of right elbow Rest Ice ROM exercises encouraged - CMP14+EGFR - CBC with Differential/Platelet - predniSONE (STERAPRED UNI-PAK 21 TAB) 10 MG (21) TBPK tablet; Use as directed  Dispense: 21 tablet; Refill: 0 - diclofenac (VOLTAREN) 75 MG EC tablet; Take 1 tablet (75  mg total) by mouth 2 (two) times daily.  Dispense: 60 tablet; Refill: 1   Labs pending Health Maintenance reviewed Diet and exercise encouraged  Follow up plan: 1 year    Evelina Dun, FNP

## 2018-10-02 NOTE — Patient Instructions (Signed)
Tennis Elbow  Tennis elbow (lateral epicondylitis) is inflammation of tendons in your outer forearm, near your elbow. Tendons are tissues that connect muscle to bone. When you have tennis elbow, inflammation affects the tendons that you use to bend your wrist and move your hand up. Inflammation occurs in the lower part of the upper arm bone (humerus), where the tendons connect to the bone (lateral epicondyle). Tennis elbow often affects people who play tennis, but anyone may get the condition from repeatedly extending the wrist or turning the forearm. What are the causes? This condition is usually caused by repeatedly extending the wrist, turning the forearm, and using the hands. It can result from sports or work that requires repetitive forearm movements. In some cases, it may be caused by a sudden injury. What increases the risk? You are more likely to develop tennis elbow if you play tennis or another racket sport. You also have a higher risk if you frequently use your hands for work. Besides people who play tennis, others at greater risk include:  Musicians.  Carpenters, painters, and plumbers.  Cooks.  Cashiers.  People who work in factories.  Construction workers.  Butchers.  People who use computers. What are the signs or symptoms? Symptoms of this condition include:  Pain and tenderness in the forearm and the outer part of the elbow. Pain may be felt only when using the arm, or it may be there all the time.  A burning feeling that starts in the elbow and spreads down the forearm.  A weak grip in the hand. How is this diagnosed? This condition may be diagnosed based on:  Your symptoms and medical history.  A physical exam.  X-rays.  MRI. How is this treated? Resting and icing your arm is often the first treatment. Your health care provider may also recommend:  Medicines to reduce pain and inflammation. These may be in the form of a pill, topical gels, or shots of a  steroid medicine (cortisone).  An elbow strap to reduce stress on the area.  Physical therapy. This may include massage or exercises.  An elbow brace to restrict the movements that cause symptoms. If these treatments do not help relieve your symptoms, your health care provider may recommend surgery to remove damaged muscle and reattach healthy muscle to bone. Follow these instructions at home: Activity  Rest your elbow and wrist and avoid activities that cause symptoms, as told by your health care provider.  Do physical therapy exercises as instructed.  If you lift an object, lift it with your palm facing up. This reduces stress on your elbow. Lifestyle  If your tennis elbow is caused by sports, check your equipment and make sure that: ? You are using it correctly. ? It is the best fit for you.  If your tennis elbow is caused by work or computer use, take frequent breaks to stretch your arm. Talk with your manager about ways to manage your condition at work. If you have a brace:  Wear the brace or strap as told by your health care provider. Remove it only as told by your health care provider.  Loosen the brace if your fingers tingle, become numb, or turn cold and blue.  Keep the brace clean.  If the brace is not waterproof, ask if you may remove it for bathing. If you must keep the brace on while bathing: ? Do not let it get wet. ? Cover it with a watertight covering when you take a   bath or a shower. General instructions   If directed, put ice on the painful area: ? Put ice in a plastic bag. ? Place a towel between your skin and the bag. ? Leave the ice on for 20 minutes, 2-3 times a day.  Take over-the-counter and prescription medicines only as told by your health care provider.  Keep all follow-up visits as told by your health care provider. This is important. Contact a health care provider if:  You have pain that gets worse or does not get better with treatment.   You have numbness or weakness in your forearm, hand, or fingers. Summary  Tennis elbow (lateral epicondylitis) is inflammation of tendons in your outer forearm, near your elbow.  Common symptoms include pain and tenderness in your forearm and the outer part of your elbow.  This condition is usually caused by repeatedly extending your wrist, turning your forearm, and using your hands.  The first treatment is often resting and icing your arm to relieve symptoms. Further treatment may include taking medicine, getting physical therapy, wearing a brace or strap, or having surgery. This information is not intended to replace advice given to you by your health care provider. Make sure you discuss any questions you have with your health care provider. Document Released: 12/21/2004 Document Revised: 09/16/2017 Document Reviewed: 10/05/2016 Elsevier Patient Education  2020 [employer].  

## 2018-10-03 LAB — CMP14+EGFR
ALT: 13 IU/L (ref 0–32)
AST: 17 IU/L (ref 0–40)
Albumin/Globulin Ratio: 1.1 — ABNORMAL LOW (ref 1.2–2.2)
Albumin: 4.1 g/dL (ref 3.8–4.8)
Alkaline Phosphatase: 95 IU/L (ref 39–117)
BUN/Creatinine Ratio: 16 (ref 9–23)
BUN: 11 mg/dL (ref 6–20)
Bilirubin Total: 0.3 mg/dL (ref 0.0–1.2)
CO2: 22 mmol/L (ref 20–29)
Calcium: 9.3 mg/dL (ref 8.7–10.2)
Chloride: 102 mmol/L (ref 96–106)
Creatinine, Ser: 0.7 mg/dL (ref 0.57–1.00)
GFR calc Af Amer: 127 mL/min/{1.73_m2} (ref >59)
GFR calc non Af Amer: 110 mL/min/{1.73_m2} (ref >59)
Globulin, Total: 3.6 g/dL (ref 1.5–4.5)
Glucose: 101 mg/dL — ABNORMAL HIGH (ref 65–99)
Potassium: 4.5 mmol/L (ref 3.5–5.2)
Sodium: 136 mmol/L (ref 134–144)
Total Protein: 7.7 g/dL (ref 6.0–8.5)

## 2018-10-03 LAB — CBC WITH DIFFERENTIAL/PLATELET
Basophils Absolute: 0 10*3/uL (ref 0.0–0.2)
Basos: 0 %
EOS (ABSOLUTE): 0.1 10*3/uL (ref 0.0–0.4)
Eos: 1 %
Hematocrit: 37.7 % (ref 34.0–46.6)
Hemoglobin: 12.2 g/dL (ref 11.1–15.9)
Immature Grans (Abs): 0 10*3/uL (ref 0.0–0.1)
Immature Granulocytes: 0 %
Lymphocytes Absolute: 1.7 10*3/uL (ref 0.7–3.1)
Lymphs: 19 %
MCH: 28.2 pg (ref 26.6–33.0)
MCHC: 32.4 g/dL (ref 31.5–35.7)
MCV: 87 fL (ref 79–97)
Monocytes Absolute: 0.6 10*3/uL (ref 0.1–0.9)
Monocytes: 6 %
Neutrophils Absolute: 6.7 10*3/uL (ref 1.4–7.0)
Neutrophils: 74 %
Platelets: 321 10*3/uL (ref 150–450)
RBC: 4.33 x10E6/uL (ref 3.77–5.28)
RDW: 13.2 % (ref 11.7–15.4)
WBC: 9.1 10*3/uL (ref 3.4–10.8)

## 2018-10-03 LAB — LIPID PANEL
Chol/HDL Ratio: 4.9 ratio — ABNORMAL HIGH (ref 0.0–4.4)
Cholesterol, Total: 175 mg/dL (ref 100–199)
HDL: 36 mg/dL — ABNORMAL LOW (ref >39)
LDL Chol Calc (NIH): 113 mg/dL — ABNORMAL HIGH (ref 0–99)
Triglycerides: 145 mg/dL (ref 0–149)
VLDL Cholesterol Cal: 26 mg/dL (ref 5–40)

## 2018-10-03 LAB — TSH: TSH: 4.5 u[IU]/mL (ref 0.450–4.500)

## 2018-12-26 ENCOUNTER — Other Ambulatory Visit: Payer: Self-pay | Admitting: Family

## 2018-12-26 DIAGNOSIS — M7711 Lateral epicondylitis, right elbow: Secondary | ICD-10-CM

## 2020-12-02 ENCOUNTER — Other Ambulatory Visit (HOSPITAL_COMMUNITY)
Admission: RE | Admit: 2020-12-02 | Discharge: 2020-12-02 | Disposition: A | Discharge status: Home / Self Care | Payer: 59 | Source: Ambulatory Visit | Attending: Family | Admitting: Family

## 2020-12-02 ENCOUNTER — Ambulatory Visit (INDEPENDENT_AMBULATORY_CARE_PROVIDER_SITE_OTHER): Payer: 59 | Admitting: Family

## 2020-12-02 ENCOUNTER — Encounter: Payer: Self-pay | Admitting: Family

## 2020-12-02 VITALS — BP 117/83 | HR 112 | Temp 98.4°F | Ht 66.0 in | Wt 188.8 lb

## 2020-12-02 DIAGNOSIS — Z0000 Encounter for general adult medical examination without abnormal findings: Secondary | ICD-10-CM | POA: Insufficient documentation

## 2020-12-02 DIAGNOSIS — Z0001 Encounter for general adult medical examination with abnormal findings: Secondary | ICD-10-CM

## 2020-12-02 DIAGNOSIS — E669 Obesity, unspecified: Secondary | ICD-10-CM | POA: Diagnosis not present

## 2020-12-02 DIAGNOSIS — R197 Diarrhea, unspecified: Secondary | ICD-10-CM

## 2020-12-02 DIAGNOSIS — L732 Hidradenitis suppurativa: Secondary | ICD-10-CM

## 2020-12-02 DIAGNOSIS — Z01411 Encounter for gynecological examination (general) (routine) with abnormal findings: Secondary | ICD-10-CM

## 2020-12-02 DIAGNOSIS — K580 Irritable bowel syndrome with diarrhea: Secondary | ICD-10-CM

## 2020-12-02 MED ORDER — HIBICLENS 4 % EX LIQD: Freq: Every day | CUTANEOUS | 11 refills | Status: DC | PRN

## 2020-12-02 MED ORDER — SPIRONOLACTONE 50 MG PO TABS: 50.0000 mg | ORAL_TABLET | Freq: Every day | ORAL | 3 refills | Status: DC

## 2020-12-02 MED ORDER — DICYCLOMINE HCL 20 MG PO TABS: 20.0000 mg | ORAL_TABLET | Freq: Three times a day (TID) | ORAL | 6 refills | Status: DC

## 2020-12-02 MED ORDER — DICLOFENAC SODIUM 75 MG PO TBEC: 75.0000 mg | DELAYED_RELEASE_TABLET | Freq: Two times a day (BID) | ORAL | 0 refills | Status: DC

## 2020-12-02 MED ORDER — SULFAMETHOXAZOLE-TRIMETHOPRIM 800-160 MG PO TABS: 1.0000 | ORAL_TABLET | Freq: Two times a day (BID) | ORAL | 2 refills | Status: DC

## 2020-12-02 NOTE — Progress Notes (Signed)
Subjective:    Patient ID: Jacqueline Andrade, female    DOB: Jun 12, 1980, 40 y.o.   MRN: [id number]  Chief Complaint  Patient presents with   Medical Management of Chronic Issues    HPI Pt presents to the office today for CPE with pap. She has hidradenitis suppurativa and usually takes spironolactone 50 mg daily and has a rx of bactrim as needed. However, she has been out of these mediations for several months. States she has several abscess on her buttocks, under right axilla. States they are draining a milky solution.     Pt has IBS with diarrhea. She takes the Bentyl BID that helps.    Review of Systems  All other systems reviewed and are negative.     Family History  Problem Relation Age of Onset   Diabetes Mother    Hypertension Mother    Hyperlipidemia Mother    Glaucoma Mother    Diabetes Father    Hyperlipidemia Father    Glaucoma Father    Glaucoma Other    Social History   Socioeconomic History   Marital status: Married    Spouse name: Not on file   Number of children: Not on file   Years of education: Not on file   Highest education level: Not on file  Occupational History   Not on file  Tobacco Use   Smoking status: Never   Smokeless tobacco: Never  Vaping Use   Vaping Use: Never used  Substance and Sexual Activity   Alcohol use: Yes    Comment: rare   Drug use: No   Sexual activity: Yes    Birth control/protection: Surgical  Other Topics Concern   Not on file  Social History Narrative   Not on file   Social Determinants of Health   Financial Resource Strain: Not on file  Food Insecurity: Not on file  Transportation Needs: Not on file  Physical Activity: Not on file  Stress: Not on file  Social Connections: Not on file    Objective:   Physical Exam Vitals reviewed.  Constitutional:      General: She is not in acute distress.    Appearance: She is well-developed.  HENT:     Head: Normocephalic and atraumatic.     Right Ear: Tympanic  membrane normal.     Left Ear: Tympanic membrane normal.  Eyes:     Pupils: Pupils are equal, round, and reactive to light.  Neck:     Thyroid: No thyromegaly.  Cardiovascular:     Rate and Rhythm: Normal rate and regular rhythm.     Heart sounds: Normal heart sounds. No murmur heard. Pulmonary:     Effort: Pulmonary effort is normal. No respiratory distress.     Breath sounds: Normal breath sounds. No wheezing.  Abdominal:     General: Bowel sounds are normal. There is no distension.     Palpations: Abdomen is soft.     Tenderness: There is no abdominal tenderness.  Musculoskeletal:        General: No tenderness. Normal range of motion.     Cervical back: Normal range of motion and neck supple.  Skin:    General: Skin is warm and dry.     Findings: Abscess present.          Comments: Multiple abscess under right axilla and in gluteal folds, draining a serous discharge  Neurological:     Mental Status: She is alert and oriented to person, place, and  time.     Cranial Nerves: No cranial nerve deficit.     Deep Tendon Reflexes: Reflexes are normal and symmetric.  Psychiatric:        Behavior: Behavior normal.        Thought Content: Thought content normal.        Judgment: Judgment normal.     BP 117/83   Pulse (!) 112   Temp 98.4 F (36.9 C) (Temporal)   Ht _0  (1.676 m)   Wt 188 lb 12.8 oz (85.6 kg)   BMI 30.47 kg/m       Assessment & Plan:  Jacqueline Andrade comes in today with chief complaint of Medical Management of Chronic Issues   Diagnosis and orders addressed:  1. Diarrhea, unspecified type - dicyclomine (BENTYL) 20 MG tablet; Take 1 tablet (20 mg total) by mouth 4 (four) times daily -  before meals and at bedtime.  Dispense: 120 tablet; Refill: 6 - CMP14+EGFR - CBC with Differential/Platelet  2. Irritable bowel syndrome with diarrhea - dicyclomine (BENTYL) 20 MG tablet; Take 1 tablet (20 mg total) by mouth 4 (four) times daily -  before meals and at  bedtime.  Dispense: 120 tablet; Refill: 6 - CMP14+EGFR - CBC with Differential/Platelet  3. Annual physical exam - CMP14+EGFR - CBC with Differential/Platelet - Lipid panel - TSH - Cytology - PAP([place])  4. Hidradenitis suppurativa Restart spironolactone. Start bactrim and hibiclens daily Warm compresses  Keep clean and dry - CMP14+EGFR - CBC with Differential/Platelet - spironolactone (ALDACTONE) 50 MG tablet; Take 1 tablet (50 mg total) by mouth daily.  Dispense: 90 tablet; Refill: 3 - sulfamethoxazole-trimethoprim (BACTRIM DS) 800-160 MG tablet; Take 1 tablet by mouth 2 (two) times daily.  Dispense: 30 tablet; Refill: 2 - chlorhexidine (HIBICLENS) 4 % external liquid; Apply topically daily as needed.  Dispense: 946 mL; Refill: 11  5. Obesity (BMI 30-39.9) - CMP14+EGFR - CBC with Differential/Platelet  6. Encounter for gynecological examination with abnormal finding - CMP14+EGFR - CBC with Differential/Platelet - Cytology - PAP([place])   Labs pending Health Maintenance reviewed Diet and exercise encouraged  Follow up plan: 6 months   Evelina Dun, FNP

## 2020-12-02 NOTE — Patient Instructions (Addendum)
Hirsutism and Masculinization Hirsutism is when a female has an excessive amount of hair in an area where a female would typically have hair growth, such as on the face, chest, or back. Masculinization is when a female's body develops certain characteristics that are like a female's body. What are the causes? This condition may be caused by: Polycystic ovary syndrome (PCOS). This is the most common cause. Certain medicines, such as androgens and anabolic steroids. Cushing's syndrome. Tumors. Increased levels of androgen (testosterone). What increases the risk? The following factors may make you more likely to develop this condition: Family history. Obesity. What are the signs or symptoms? Hirsutism Symptoms of this condition include excess hair growth in areas where women typically do not grow hair, such as on the: Face. Chest. Thighs. Back. Masculinization Symptoms of this condition include: Deepening voice. Decreased breast size. Increased muscle mass. Thinning hair on the head (balding). Irregular or no menstrual periods. Enlarged clitoris. How is this diagnosed? These conditions may be diagnosed based on: Your medical history. A physical exam. Tests that may include: Blood tests. Imaging studies. Your health care provider may recommend that you follow up with specialists to understand the cause of your condition or to help treat it. How is this treated? This condition may be treated by: Taking medicines to help control hair growth. Making lifestyle changes to help reduce hormone levels that are contributing to your condition. Removing unwanted hair by shaving, using creams, or waxing. More permanent ways to remove hair include electrolysis and laser treatments. If your symptoms are caused by certain medicines, your health care provider may have you stop taking them. Follow these instructions at home: Take over-the-counter and prescription medicines only as told by your  health care provider. Talk with your health care provider about your treatment options and what may be best for you. Maintain a healthy weight. If needed, talk with your health care provider about how to lose weight. Keep all follow-up visits. This is important. Contact a health care provider if: Your symptoms suddenly get worse. You develop acne. You have irregular menstrual periods. You stop having your menstrual period. You feel a lump in your lower abdomen. Summary Hirsutism is when a female has an excessive amount of hair in an area where a female would typically have hair growth, such as on the face, chest, thighs, or back. Masculinization is when a female's body develops characteristics like a female's body. This may include a deepening voice, decreased breast size, increased muscle mass, thinning hair (balding), changes in menstrual periods, and clitoris growth. The most common cause of this condition is polycystic ovary syndrome (PCOS). Treatment options include removing unwanted hair, taking medicines, and making lifestyle changes. Talk with your health care provider about which treatments are best for you. Your health care provider may refer you to specialists to find the cause of your condition. This information is not intended to replace advice given to you by your health care provider. Make sure you discuss any questions you have with your health care provider. Document Revised: 05/31/2019 Document Reviewed: 05/31/2019 Elsevier Patient Education  2022 [employer].

## 2020-12-03 ENCOUNTER — Other Ambulatory Visit: Payer: Self-pay | Admitting: Family

## 2020-12-03 DIAGNOSIS — Z1231 Encounter for screening mammogram for malignant neoplasm of breast: Secondary | ICD-10-CM

## 2020-12-03 LAB — CMP14+EGFR
ALT: 14 IU/L (ref 0–32)
AST: 18 IU/L (ref 0–40)
Albumin/Globulin Ratio: 1 — ABNORMAL LOW (ref 1.2–2.2)
Albumin: 4 g/dL (ref 3.8–4.8)
Alkaline Phosphatase: 102 IU/L (ref 44–121)
BUN/Creatinine Ratio: 15 (ref 9–23)
BUN: 11 mg/dL (ref 6–24)
Bilirubin Total: <0.2 mg/dL (ref 0.0–1.2)
CO2: 23 mmol/L (ref 20–29)
Calcium: 9.4 mg/dL (ref 8.7–10.2)
Chloride: 103 mmol/L (ref 96–106)
Creatinine, Ser: 0.74 mg/dL (ref 0.57–1.00)
Globulin, Total: 3.9 g/dL (ref 1.5–4.5)
Glucose: 121 mg/dL — ABNORMAL HIGH (ref 70–99)
Potassium: 4.3 mmol/L (ref 3.5–5.2)
Sodium: 138 mmol/L (ref 134–144)
Total Protein: 7.9 g/dL (ref 6.0–8.5)
eGFR: 105 mL/min/{1.73_m2} (ref >59)

## 2020-12-03 LAB — CBC WITH DIFFERENTIAL/PLATELET
Basophils Absolute: 0 10*3/uL (ref 0.0–0.2)
Basos: 0 %
EOS (ABSOLUTE): 0.1 10*3/uL (ref 0.0–0.4)
Eos: 1 %
Hematocrit: 37 % (ref 34.0–46.6)
Hemoglobin: 12.2 g/dL (ref 11.1–15.9)
Immature Grans (Abs): 0 10*3/uL (ref 0.0–0.1)
Immature Granulocytes: 0 %
Lymphocytes Absolute: 2.4 10*3/uL (ref 0.7–3.1)
Lymphs: 26 %
MCH: 26.8 pg (ref 26.6–33.0)
MCHC: 33 g/dL (ref 31.5–35.7)
MCV: 81 fL (ref 79–97)
Monocytes Absolute: 0.5 10*3/uL (ref 0.1–0.9)
Monocytes: 6 %
Neutrophils Absolute: 6 10*3/uL (ref 1.4–7.0)
Neutrophils: 67 %
Platelets: 362 10*3/uL (ref 150–450)
RBC: 4.55 x10E6/uL (ref 3.77–5.28)
RDW: 14.4 % (ref 11.7–15.4)
WBC: 9 10*3/uL (ref 3.4–10.8)

## 2020-12-03 LAB — LIPID PANEL
Chol/HDL Ratio: 4.5 ratio — ABNORMAL HIGH (ref 0.0–4.4)
Cholesterol, Total: 193 mg/dL (ref 100–199)
HDL: 43 mg/dL (ref >39)
LDL Chol Calc (NIH): 114 mg/dL — ABNORMAL HIGH (ref 0–99)
Triglycerides: 204 mg/dL — ABNORMAL HIGH (ref 0–149)
VLDL Cholesterol Cal: 36 mg/dL (ref 5–40)

## 2020-12-03 LAB — TSH: TSH: 3.77 u[IU]/mL (ref 0.450–4.500)

## 2020-12-05 ENCOUNTER — Ambulatory Visit
Admission: RE | Admit: 2020-12-05 | Discharge: 2020-12-05 | Disposition: A | Discharge status: Home / Self Care | Payer: 59 | Source: Ambulatory Visit | Attending: Family | Admitting: Family

## 2020-12-05 ENCOUNTER — Other Ambulatory Visit: Payer: Self-pay

## 2020-12-05 DIAGNOSIS — Z1231 Encounter for screening mammogram for malignant neoplasm of breast: Secondary | ICD-10-CM

## 2020-12-07 LAB — CYTOLOGY - PAP
Diagnosis: NEGATIVE
Diagnosis: REACTIVE

## 2020-12-10 ENCOUNTER — Other Ambulatory Visit: Payer: Self-pay | Admitting: Family

## 2020-12-10 DIAGNOSIS — R928 Other abnormal and inconclusive findings on diagnostic imaging of breast: Secondary | ICD-10-CM

## 2020-12-31 ENCOUNTER — Other Ambulatory Visit: Payer: Self-pay | Admitting: Family

## 2021-03-04 ENCOUNTER — Other Ambulatory Visit: Payer: Self-pay | Admitting: Family

## 2021-03-04 ENCOUNTER — Ambulatory Visit
Admission: RE | Admit: 2021-03-04 | Discharge: 2021-03-04 | Disposition: A | Discharge status: Home / Self Care | Payer: 59 | Source: Ambulatory Visit | Attending: Family | Admitting: Family

## 2021-03-04 DIAGNOSIS — R928 Other abnormal and inconclusive findings on diagnostic imaging of breast: Secondary | ICD-10-CM

## 2021-05-04 ENCOUNTER — Encounter: Payer: Self-pay | Admitting: Nurse Practitioner

## 2021-05-04 ENCOUNTER — Ambulatory Visit: Payer: 59 | Admitting: Nurse Practitioner

## 2021-05-04 VITALS — BP 123/76 | HR 71 | Temp 98.4°F | Resp 20 | Ht 66.0 in | Wt 188.0 lb

## 2021-05-04 DIAGNOSIS — H6121 Impacted cerumen, right ear: Secondary | ICD-10-CM | POA: Diagnosis not present

## 2021-05-04 MED ORDER — DEBROX 6.5 % OT SOLN: 5.0000 [drp] | Freq: Two times a day (BID) | OTIC | 0 refills | Status: DC

## 2021-05-04 NOTE — Progress Notes (Signed)
? ?Acute Office Visit ? ?Subjective:  ? ?  ?Patient ID: Jacqueline Andrade, female    DOB: [birth year], 41 y.o.   MRN: [id number] ? ?Chief Complaint  ?Patient presents with  ? Ear Pain  ?  And fullness Right side - light HA , pressure all right side   ? ? ?Ear Fullness  ?There is pain in the right ear. This is a new problem. The current episode started in the past 7 days. The problem has been unchanged. There has been no fever. The patient is experiencing no pain. Pertinent negatives include no coughing, diarrhea, headaches or rash. She has tried nothing for the symptoms.  ? ? ?Review of Systems  ?Constitutional: Negative.  Negative for chills and fever.  ?HENT:  Positive for ear pain.   ?Eyes: Negative.   ?Respiratory:  Negative for cough.   ?Cardiovascular: Negative.   ?Gastrointestinal:  Negative for diarrhea.  ?Skin: Negative.  Negative for rash.  ?Neurological:  Negative for headaches.  ?All other systems reviewed and are negative. ? ? ?   ?Objective:  ?  ?BP 123/76   Pulse 71   Temp 98.4 ?F (36.9 ?C)   Resp 20   Ht 5\' 6"  (1.676 m)   Wt 188 lb (85.3 kg)   LMP 04/20/2021 (Approximate)   SpO2 98%   BMI 30.34 kg/m?  ?BP Readings from Last 3 Encounters:  ?05/04/21 123/76  ?12/02/20 117/83  ?10/02/18 124/85  ? ?Wt Readings from Last 3 Encounters:  ?05/04/21 188 lb (85.3 kg)  ?12/02/20 188 lb 12.8 oz (85.6 kg)  ?10/02/18 191 lb (86.6 kg)  ? ?  ? ?Physical Exam ?Vitals and nursing note reviewed.  ?Constitutional:   ?   Appearance: Normal appearance.  ?HENT:  ?   Head: Normocephalic.  ?   Right Ear: External ear normal. No decreased hearing noted. Tenderness present. No drainage. There is impacted cerumen. No foreign body.  ?   Left Ear: External ear normal. No decreased hearing noted. No drainage or tenderness.  ?   Nose: Nose normal.  ?   Mouth/Throat:  ?   Mouth: Mucous membranes are moist.  ?Eyes:  ?   Conjunctiva/sclera: Conjunctivae normal.  ?Cardiovascular:  ?   Rate and Rhythm: Normal rate and regular  rhythm.  ?   Pulses: Normal pulses.  ?   Heart sounds: Normal heart sounds.  ?Abdominal:  ?   General: Bowel sounds are normal.  ?Neurological:  ?   General: No focal deficit present.  ?   Mental Status: She is alert and oriented to person, place, and time.  ?Psychiatric:     ?   Behavior: Behavior normal.  ? ? ?No results found for any visits on 05/04/21. ? ? ?   ?Assessment & Plan:  ?Right ear tenderness, assessed cerumen impaction. ?Ear irrigation completed, patient tolerated well. ?Debrox over-the-counter for future use and/or hydrogen peroxide mixed with water for ear irrigation. ?Education provided to patient printed handouts given. ?Patient knows to follow-up with worsening or unresolved symptoms. ?Problem List Items Addressed This Visit   ?None ?Visit Diagnoses   ? ? Impacted cerumen of right ear    -  Primary  ? Relevant Medications  ? carbamide peroxide (DEBROX) 6.5 % OTIC solution  ? ?  ? ? ?Meds ordered this encounter  ?Medications  ? carbamide peroxide (DEBROX) 6.5 % OTIC solution  ?  Sig: Place 5 drops into the right ear 2 (two) times daily.  ?  Dispense:  15 mL  ?  Refill:  0  ?  Order Specific Question:   Supervising Provider  ?  AnswerMechele Claude [[id number]]  ? ? ?Return if symptoms worsen or fail to improve. ? ?Daryll Drown, NP ? ? ?

## 2021-05-04 NOTE — Patient Instructions (Signed)

## 2021-08-31 ENCOUNTER — Other Ambulatory Visit: Payer: Self-pay | Admitting: Family

## 2021-09-30 ENCOUNTER — Ambulatory Visit: Payer: 59 | Admitting: Family Medicine

## 2021-09-30 ENCOUNTER — Encounter: Payer: Self-pay | Admitting: Family Medicine

## 2021-09-30 VITALS — BP 117/89 | HR 113 | Temp 99.0°F | Ht 66.0 in | Wt 185.0 lb

## 2021-09-30 DIAGNOSIS — L732 Hidradenitis suppurativa: Secondary | ICD-10-CM | POA: Diagnosis not present

## 2021-09-30 MED ORDER — DOXYCYCLINE HYCLATE 100 MG PO TABS: 100.0000 mg | ORAL_TABLET | Freq: Two times a day (BID) | ORAL | 0 refills | Status: AC

## 2021-09-30 MED ORDER — LIDOCAINE 5 % EX OINT: 1.0000 | TOPICAL_OINTMENT | CUTANEOUS | 0 refills | Status: DC | PRN

## 2021-09-30 NOTE — Progress Notes (Signed)
Subjective:  Patient ID: Jacqueline Andrade, female    DOB: 14-Feb-1980, 41 y.o.   MRN: [id number]  Patient Care Team: Sharion Balloon, FNP as PCP - General (Nurse Practitioner)   Chief Complaint:  spots on her buttocks (X 1 year/Flare up)   HPI: Jacqueline Andrade is a 41 y.o. female presenting on 09/30/2021 for spots on her buttocks (X 1 year/Flare up)   Pt presents today for flare of HS. States over the last several weeks the abscesses have worsened. She started taking her bactrim again but denies improvement in symptoms. States yesterday she had chills and worsening pain. States lesions started draining last night.     Relevant past medical, surgical, family, and social history reviewed and updated as indicated.  Allergies and medications reviewed and updated. Data reviewed: Chart in Epic.   History reviewed. No pertinent past medical history.  Past Surgical History:  Procedure Laterality Date   ANKLE SURGERY Right    BUNIONECTOMY Bilateral    CHOLECYSTECTOMY OPEN     KNEE SURGERY Right    TUBAL LIGATION      Social History   Socioeconomic History   Marital status: Married    Spouse name: Not on file   Number of children: Not on file   Years of education: Not on file   Highest education level: Not on file  Occupational History   Not on file  Tobacco Use   Smoking status: Never   Smokeless tobacco: Never  Vaping Use   Vaping Use: Never used  Substance and Sexual Activity   Alcohol use: Yes    Comment: rare   Drug use: No   Sexual activity: Yes    Birth control/protection: Surgical  Other Topics Concern   Not on file  Social History Narrative   Not on file   Social Determinants of Health   Financial Resource Strain: Not on file  Food Insecurity: Not on file  Transportation Needs: Not on file  Physical Activity: Not on file  Stress: Not on file  Social Connections: Not on file  Intimate Partner Violence: Not on file    Outpatient Encounter  Medications as of 09/30/2021  Medication Sig   carbamide peroxide (DEBROX) 6.5 % OTIC solution Place 5 drops into the right ear 2 (two) times daily.   chlorhexidine (HIBICLENS) 4 % external liquid Apply topically daily as needed.   [employer] ANTISEPTIC SKIN CLEANSER 4 % SOLN Apply topically daily as needed.   diclofenac (VOLTAREN) 75 MG EC tablet TAKE 1 TABLET BY MOUTH TWICE A DAY   diclofenac sodium (VOLTAREN) 1 % GEL Apply topically 4 (four) times daily.   dicyclomine (BENTYL) 20 MG tablet Take 1 tablet (20 mg total) by mouth 4 (four) times daily -  before meals and at bedtime.   doxycycline (VIBRA-TABS) 100 MG tablet Take 1 tablet (100 mg total) by mouth 2 (two) times daily for 10 days. 1 po bid   lidocaine (XYLOCAINE) 5 % ointment Apply 1 Application topically as needed.   spironolactone (ALDACTONE) 50 MG tablet Take 1 tablet (50 mg total) by mouth daily.   [DISCONTINUED] sulfamethoxazole-trimethoprim (BACTRIM DS) 800-160 MG tablet Take 1 tablet by mouth 2 (two) times daily.   No facility-administered encounter medications on file as of 09/30/2021.    No Known Allergies  Review of Systems  Constitutional:  Positive for chills. Negative for activity change, appetite change, fatigue and fever.  HENT: Negative.    Eyes: Negative.  Respiratory:  Negative for cough, chest tightness and shortness of breath.   Cardiovascular:  Negative for chest pain, palpitations and leg swelling.  Gastrointestinal:  Negative for blood in stool, constipation, diarrhea, nausea and vomiting.  Endocrine: Negative.   Genitourinary:  Negative for dysuria, frequency and urgency.  Musculoskeletal:  Negative for arthralgias and myalgias.  Skin:  Positive for color change, rash and wound. Negative for pallor.  Allergic/Immunologic: Negative.   Neurological:  Negative for dizziness and headaches.  Hematological: Negative.   Psychiatric/Behavioral:  Negative for confusion, hallucinations, sleep disturbance and suicidal  ideas.   All other systems reviewed and are negative.       Objective:  BP 117/89   Pulse (!) 113   Temp 99 F (37.2 C)   Ht _0  (1.676 m)   Wt 185 lb (83.9 kg)   SpO2 97%   BMI 29.86 kg/m    Wt Readings from Last 3 Encounters:  09/30/21 185 lb (83.9 kg)  05/04/21 188 lb (85.3 kg)  12/02/20 188 lb 12.8 oz (85.6 kg)    Physical Exam Vitals and nursing note reviewed.  Constitutional:      General: She is in acute distress (uncomfortable).     Appearance: Normal appearance.  HENT:     Head: Normocephalic and atraumatic.     Nose: Nose normal.     Mouth/Throat:     Mouth: Mucous membranes are moist.  Eyes:     Pupils: Pupils are equal, round, and reactive to light.  Cardiovascular:     Rate and Rhythm: Normal rate and regular rhythm.     Heart sounds: Normal heart sounds.  Pulmonary:     Effort: Pulmonary effort is normal.     Breath sounds: Normal breath sounds.  Skin:    General: Skin is warm and dry.     Capillary Refill: Capillary refill takes less than 2 seconds.     Findings: Abscess and erythema present.       Neurological:     General: No focal deficit present.     Mental Status: She is alert and oriented to person, place, and time.  Psychiatric:        Mood and Affect: Mood normal.        Behavior: Behavior normal.        Thought Content: Thought content normal.        Judgment: Judgment normal.     Results for orders placed or performed in visit on 12/02/20  CMP14+EGFR  Result Value Ref Range   Glucose 121 (H) 70 - 99 mg/dL   BUN 11 6 - 24 mg/dL   Creatinine, Ser 0.74 0.57 - 1.00 mg/dL   eGFR 105 >59 mL/min/1.73   BUN/Creatinine Ratio 15 9 - 23   Sodium 138 134 - 144 mmol/L   Potassium 4.3 3.5 - 5.2 mmol/L   Chloride 103 96 - 106 mmol/L   CO2 23 20 - 29 mmol/L   Calcium 9.4 8.7 - 10.2 mg/dL   Total Protein 7.9 6.0 - 8.5 g/dL   Albumin 4.0 3.8 - 4.8 g/dL   Globulin, Total 3.9 1.5 - 4.5 g/dL   Albumin/Globulin Ratio 1.0 (L) 1.2 - 2.2    Bilirubin Total <0.2 0.0 - 1.2 mg/dL   Alkaline Phosphatase 102 44 - 121 IU/L   AST 18 0 - 40 IU/L   ALT 14 0 - 32 IU/L  CBC with Differential/Platelet  Result Value Ref Range   WBC 9.0 3.4 - 10.8  x10E3/uL   RBC 4.55 3.77 - 5.28 x10E6/uL   Hemoglobin 12.2 11.1 - 15.9 g/dL   Hematocrit 37.0 34.0 - 46.6 %   MCV 81 79 - 97 fL   MCH 26.8 26.6 - 33.0 pg   MCHC 33.0 31.5 - 35.7 g/dL   RDW 14.4 11.7 - 15.4 %   Platelets 362 150 - 450 x10E3/uL   Neutrophils 67 Not Estab. %   Lymphs 26 Not Estab. %   Monocytes 6 Not Estab. %   Eos 1 Not Estab. %   Basos 0 Not Estab. %   Neutrophils Absolute 6.0 1.4 - 7.0 x10E3/uL   Lymphocytes Absolute 2.4 0.7 - 3.1 x10E3/uL   Monocytes Absolute 0.5 0.1 - 0.9 x10E3/uL   EOS (ABSOLUTE) 0.1 0.0 - 0.4 x10E3/uL   Basophils Absolute 0.0 0.0 - 0.2 x10E3/uL   Immature Granulocytes 0 Not Estab. %   Immature Grans (Abs) 0.0 0.0 - 0.1 x10E3/uL  Lipid panel  Result Value Ref Range   Cholesterol, Total 193 100 - 199 mg/dL   Triglycerides 204 (H) 0 - 149 mg/dL   HDL 43 >39 mg/dL   VLDL Cholesterol Cal 36 5 - 40 mg/dL   LDL Chol Calc (NIH) 114 (H) 0 - 99 mg/dL   Chol/HDL Ratio 4.5 (H) 0.0 - 4.4 ratio  TSH  Result Value Ref Range   TSH 3.770 0.450 - 4.500 uIU/mL  Cytology - PAP([place])  Result Value Ref Range   Adequacy      Satisfactory for evaluation; transformation zone component PRESENT.   Diagnosis      - Negative for Intraepithelial Lesions or Malignancy (NILM)   Diagnosis - Benign reactive/reparative changes        Pertinent labs & imaging results that were available during my care of the patient were reviewed by me and considered in my medical decision making.  Assessment & Plan:  Patrecia was seen today for spots on her buttocks.  Diagnoses and all orders for this visit:  Hidradenitis suppurativa Will switch bactrim to doxycycline to see if beneficial. Due to significance of discomfort, will provide topical lidocaine for pain control.  Referral to derm placed as symptoms seem to be worsening.  -     doxycycline (VIBRA-TABS) 100 MG tablet; Take 1 tablet (100 mg total) by mouth 2 (two) times daily for 10 days. 1 po bid -     lidocaine (XYLOCAINE) 5 % ointment; Apply 1 Application topically as needed. -     Ambulatory referral to Dermatology     Continue all other maintenance medications.  Follow up plan: Return if symptoms worsen or fail to improve.   Continue healthy lifestyle choices, including diet (rich in fruits, vegetables, and lean proteins, and low in salt and simple carbohydrates) and exercise (at least 30 minutes of moderate physical activity daily).  Educational handout given for HS  The above assessment and management plan was discussed with the patient. The patient verbalized understanding of and has agreed to the management plan. Patient is aware to call the clinic if they develop any new symptoms or if symptoms persist or worsen. Patient is aware when to return to the clinic for a follow-up visit. Patient educated on when it is appropriate to go to the emergency department.   Monia Pouch, FNP-C [facility] [phone]

## 2021-10-08 DIAGNOSIS — Z029 Encounter for administrative examinations, unspecified: Secondary | ICD-10-CM

## 2021-10-21 ENCOUNTER — Encounter: Payer: Self-pay | Admitting: Family Medicine

## 2021-10-21 ENCOUNTER — Telehealth: Payer: Self-pay

## 2021-10-21 ENCOUNTER — Ambulatory Visit (INDEPENDENT_AMBULATORY_CARE_PROVIDER_SITE_OTHER): Payer: 59 | Admitting: Family Medicine

## 2021-10-21 VITALS — BP 144/85 | HR 97 | Temp 97.8°F | Ht 66.0 in | Wt 186.6 lb

## 2021-10-21 DIAGNOSIS — L732 Hidradenitis suppurativa: Secondary | ICD-10-CM | POA: Diagnosis not present

## 2021-10-21 MED ORDER — LIDOCAINE 5 % EX OINT: 1.0000 | TOPICAL_OINTMENT | CUTANEOUS | 0 refills | Status: DC | PRN

## 2021-10-21 MED ORDER — DOXYCYCLINE HYCLATE 100 MG PO TABS: 100.0000 mg | ORAL_TABLET | Freq: Two times a day (BID) | ORAL | 0 refills | Status: AC

## 2021-10-21 NOTE — Telephone Encounter (Signed)
Ok with me 

## 2021-10-21 NOTE — Telephone Encounter (Signed)
Patient would like to switch from [place] to [place]. Please advise if this is ok

## 2021-10-21 NOTE — Progress Notes (Signed)
Subjective:  Patient ID: Jacqueline Andrade, female    DOB: 03-08-1980, 41 y.o.   MRN: [id number]  Patient Care Team: Baruch Gouty, FNP as PCP - General (Family Medicine)   Chief Complaint:  HS (Questions about dx )   HPI: Jacqueline Andrade is a 41 y.o. female presenting on 10/21/2021 for HS (Questions about dx )   Patient presents today to discuss HS. she was last seen in office on 09/30/2021 for a flare.  At this time she was placed on doxycycline and topical lidocaine for pain relief.  Referral to dermatology was placed.  She states she has an appointment in the next couple weeks with dermatology.  Her biggest concern is intermittent FMLA for her condition.  States she has significant episodes of pain with walking and sitting for long periods of time.  States that when she sits for long.  She has burning and shooting pain and then when she tries to walk the areas rub together causing more pain.  She reports the lesions have decreased in size but are still present and very tender.  No systemic signs of infection.    Relevant past medical, surgical, family, and social history reviewed and updated as indicated.  Allergies and medications reviewed and updated. Data reviewed: Chart in Epic.   History reviewed. No pertinent past medical history.  Past Surgical History:  Procedure Laterality Date   ANKLE SURGERY Right    BUNIONECTOMY Bilateral    CHOLECYSTECTOMY OPEN     KNEE SURGERY Right    TUBAL LIGATION      Social History   Socioeconomic History   Marital status: Married    Spouse name: Not on file   Number of children: Not on file   Years of education: Not on file   Highest education level: Not on file  Occupational History   Not on file  Tobacco Use   Smoking status: Never   Smokeless tobacco: Never  Vaping Use   Vaping Use: Never used  Substance and Sexual Activity   Alcohol use: Yes    Comment: rare   Drug use: No   Sexual activity: Yes    Birth  control/protection: Surgical  Other Topics Concern   Not on file  Social History Narrative   Not on file   Social Determinants of Health   Financial Resource Strain: Not on file  Food Insecurity: Not on file  Transportation Needs: Not on file  Physical Activity: Not on file  Stress: Not on file  Social Connections: Not on file  Intimate Partner Violence: Not on file    Outpatient Encounter Medications as of 10/21/2021  Medication Sig   chlorhexidine (HIBICLENS) 4 % external liquid Apply topically daily as needed.   [employer] ANTISEPTIC SKIN CLEANSER 4 % SOLN Apply topically daily as needed.   diclofenac (VOLTAREN) 75 MG EC tablet TAKE 1 TABLET BY MOUTH TWICE A DAY   diclofenac sodium (VOLTAREN) 1 % GEL Apply topically 4 (four) times daily.   dicyclomine (BENTYL) 20 MG tablet Take 1 tablet (20 mg total) by mouth 4 (four) times daily -  before meals and at bedtime.   doxycycline (VIBRA-TABS) 100 MG tablet Take 1 tablet (100 mg total) by mouth 2 (two) times daily for 10 days. 1 po bid   spironolactone (ALDACTONE) 50 MG tablet Take 1 tablet (50 mg total) by mouth daily.   [DISCONTINUED] lidocaine (XYLOCAINE) 5 % ointment Apply 1 Application topically as needed.  lidocaine (XYLOCAINE) 5 % ointment Apply 1 Application topically as needed.   [DISCONTINUED] carbamide peroxide (DEBROX) 6.5 % OTIC solution Place 5 drops into the right ear 2 (two) times daily.   No facility-administered encounter medications on file as of 10/21/2021.    No Known Allergies  Review of Systems  Constitutional:  Negative for activity change, appetite change, chills, diaphoresis, fatigue, fever and unexpected weight change.  HENT: Negative.    Eyes: Negative.   Respiratory:  Negative for cough, chest tightness and shortness of breath.   Cardiovascular:  Negative for chest pain, palpitations and leg swelling.  Gastrointestinal:  Negative for abdominal pain, blood in stool, constipation, diarrhea, nausea and  vomiting.  Endocrine: Negative.   Genitourinary:  Negative for decreased urine volume, difficulty urinating, dysuria, frequency and urgency.  Musculoskeletal:  Negative for arthralgias and myalgias.  Skin:  Positive for color change, rash and wound. Negative for pallor.  Allergic/Immunologic: Negative.   Neurological:  Negative for dizziness, weakness and headaches.  Hematological: Negative.   Psychiatric/Behavioral:  Negative for confusion, hallucinations, sleep disturbance and suicidal ideas.   All other systems reviewed and are negative.       Objective:  BP (!) 144/85   Pulse 97   Temp 97.8 F (36.6 C) (Temporal)   Ht _0  (1.676 m)   Wt 186 lb 9.6 oz (84.6 kg)   LMP 10/21/2021   SpO2 93%   BMI 30.12 kg/m    Wt Readings from Last 3 Encounters:  10/21/21 186 lb 9.6 oz (84.6 kg)  09/30/21 185 lb (83.9 kg)  05/04/21 188 lb (85.3 kg)    Physical Exam Vitals and nursing note reviewed.  Constitutional:      General: She is not in acute distress.    Appearance: Normal appearance. She is obese. She is not toxic-appearing or diaphoretic.  HENT:     Head: Normocephalic and atraumatic.     Mouth/Throat:     Mouth: Mucous membranes are moist.  Eyes:     Pupils: Pupils are equal, round, and reactive to light.  Cardiovascular:     Rate and Rhythm: Normal rate.  Pulmonary:     Effort: Pulmonary effort is normal.  Skin:    General: Skin is warm and dry.     Capillary Refill: Capillary refill takes less than 2 seconds.  Neurological:     General: No focal deficit present.     Mental Status: She is alert and oriented to person, place, and time.  Psychiatric:        Mood and Affect: Mood normal.        Behavior: Behavior normal.        Thought Content: Thought content normal.        Judgment: Judgment normal.     Results for orders placed or performed in visit on 12/02/20  CMP14+EGFR  Result Value Ref Range   Glucose 121 (H) 70 - 99 mg/dL   BUN 11 6 - 24 mg/dL    Creatinine, Ser 0.74 0.57 - 1.00 mg/dL   eGFR 105 >59 mL/min/1.73   BUN/Creatinine Ratio 15 9 - 23   Sodium 138 134 - 144 mmol/L   Potassium 4.3 3.5 - 5.2 mmol/L   Chloride 103 96 - 106 mmol/L   CO2 23 20 - 29 mmol/L   Calcium 9.4 8.7 - 10.2 mg/dL   Total Protein 7.9 6.0 - 8.5 g/dL   Albumin 4.0 3.8 - 4.8 g/dL   Globulin, Total 3.9 1.5 -  4.5 g/dL   Albumin/Globulin Ratio 1.0 (L) 1.2 - 2.2   Bilirubin Total <0.2 0.0 - 1.2 mg/dL   Alkaline Phosphatase 102 44 - 121 IU/L   AST 18 0 - 40 IU/L   ALT 14 0 - 32 IU/L  CBC with Differential/Platelet  Result Value Ref Range   WBC 9.0 3.4 - 10.8 x10E3/uL   RBC 4.55 3.77 - 5.28 x10E6/uL   Hemoglobin 12.2 11.1 - 15.9 g/dL   Hematocrit 37.0 34.0 - 46.6 %   MCV 81 79 - 97 fL   MCH 26.8 26.6 - 33.0 pg   MCHC 33.0 31.5 - 35.7 g/dL   RDW 14.4 11.7 - 15.4 %   Platelets 362 150 - 450 x10E3/uL   Neutrophils 67 Not Estab. %   Lymphs 26 Not Estab. %   Monocytes 6 Not Estab. %   Eos 1 Not Estab. %   Basos 0 Not Estab. %   Neutrophils Absolute 6.0 1.4 - 7.0 x10E3/uL   Lymphocytes Absolute 2.4 0.7 - 3.1 x10E3/uL   Monocytes Absolute 0.5 0.1 - 0.9 x10E3/uL   EOS (ABSOLUTE) 0.1 0.0 - 0.4 x10E3/uL   Basophils Absolute 0.0 0.0 - 0.2 x10E3/uL   Immature Granulocytes 0 Not Estab. %   Immature Grans (Abs) 0.0 0.0 - 0.1 x10E3/uL  Lipid panel  Result Value Ref Range   Cholesterol, Total 193 100 - 199 mg/dL   Triglycerides 204 (H) 0 - 149 mg/dL   HDL 43 >39 mg/dL   VLDL Cholesterol Cal 36 5 - 40 mg/dL   LDL Chol Calc (NIH) 114 (H) 0 - 99 mg/dL   Chol/HDL Ratio 4.5 (H) 0.0 - 4.4 ratio  TSH  Result Value Ref Range   TSH 3.770 0.450 - 4.500 uIU/mL  Cytology - PAP(Guy)  Result Value Ref Range   Adequacy      Satisfactory for evaluation; transformation zone component PRESENT.   Diagnosis      - Negative for Intraepithelial Lesions or Malignancy (NILM)   Diagnosis - Benign reactive/reparative changes        Pertinent labs & imaging results  that were available during my care of the patient were reviewed by me and considered in my medical decision making.  Assessment & Plan:  Jacqueline Andrade was seen today for hs.  Diagnoses and all orders for this visit:  Hidradenitis suppurativa Will extend antibiotic therapy and topical lidocaine therapy.  Patient aware to keep follow-up with dermatology as scheduled.  May require referral to general surgery. -     lidocaine (XYLOCAINE) 5 % ointment; Apply 1 Application topically as needed. -     doxycycline (VIBRA-TABS) 100 MG tablet; Take 1 tablet (100 mg total) by mouth 2 (two) times daily for 10 days. 1 po bid  Greater than 20 minutes of this visit was used for discussion of disease process and treatment options.  Continue all other maintenance medications.  Follow up plan: Return in about 1 month (around 11/21/2021), or if symptoms worsen or fail to improve, for CPE with PAP.   Continue healthy lifestyle choices, including diet (rich in fruits, vegetables, and lean proteins, and low in salt and simple carbohydrates) and exercise (at least 30 minutes of moderate physical activity daily).  Educational handout given for HS  The above assessment and management plan was discussed with the patient. The patient verbalized understanding of and has agreed to the management plan. Patient is aware to call the clinic if they develop any new symptoms or if  symptoms persist or worsen. Patient is aware when to return to the clinic for a follow-up visit. Patient educated on when it is appropriate to go to the emergency department.   Monia Pouch, FNP-C [facility] [phone]

## 2021-10-22 NOTE — Telephone Encounter (Signed)
Patient aware and verbalized understanding. °

## 2021-11-06 ENCOUNTER — Other Ambulatory Visit: Payer: Self-pay | Admitting: Family

## 2021-11-06 DIAGNOSIS — L732 Hidradenitis suppurativa: Secondary | ICD-10-CM

## 2021-12-02 ENCOUNTER — Other Ambulatory Visit: Payer: Self-pay | Admitting: Family

## 2021-12-03 ENCOUNTER — Other Ambulatory Visit: Payer: Self-pay | Admitting: Family

## 2021-12-03 DIAGNOSIS — K580 Irritable bowel syndrome with diarrhea: Secondary | ICD-10-CM

## 2021-12-03 DIAGNOSIS — R197 Diarrhea, unspecified: Secondary | ICD-10-CM

## 2021-12-09 ENCOUNTER — Encounter: Payer: Self-pay | Admitting: Family Medicine

## 2021-12-09 ENCOUNTER — Ambulatory Visit (INDEPENDENT_AMBULATORY_CARE_PROVIDER_SITE_OTHER): Payer: 59 | Admitting: Family Medicine

## 2021-12-09 ENCOUNTER — Other Ambulatory Visit (HOSPITAL_COMMUNITY)
Admission: RE | Admit: 2021-12-09 | Discharge: 2021-12-09 | Disposition: A | Discharge status: Home / Self Care | Payer: 59 | Source: Ambulatory Visit | Attending: Family Medicine | Admitting: Family Medicine

## 2021-12-09 VITALS — BP 125/86 | HR 97 | Temp 98.5°F | Ht 66.0 in | Wt 188.4 lb

## 2021-12-09 DIAGNOSIS — Z1329 Encounter for screening for other suspected endocrine disorder: Secondary | ICD-10-CM

## 2021-12-09 DIAGNOSIS — Z113 Encounter for screening for infections with a predominantly sexual mode of transmission: Secondary | ICD-10-CM | POA: Diagnosis present

## 2021-12-09 DIAGNOSIS — Z0000 Encounter for general adult medical examination without abnormal findings: Secondary | ICD-10-CM | POA: Diagnosis present

## 2021-12-09 DIAGNOSIS — B379 Candidiasis, unspecified: Secondary | ICD-10-CM

## 2021-12-09 DIAGNOSIS — Z1231 Encounter for screening mammogram for malignant neoplasm of breast: Secondary | ICD-10-CM

## 2021-12-09 DIAGNOSIS — Z130 Encounter for screening for diseases of the blood and blood-forming organs and certain disorders involving the immune mechanism: Secondary | ICD-10-CM

## 2021-12-09 DIAGNOSIS — Z0001 Encounter for general adult medical examination with abnormal findings: Secondary | ICD-10-CM | POA: Diagnosis not present

## 2021-12-09 DIAGNOSIS — Z13220 Encounter for screening for lipoid disorders: Secondary | ICD-10-CM

## 2021-12-09 LAB — URINALYSIS, ROUTINE W REFLEX MICROSCOPIC
Bilirubin, UA: NEGATIVE
Glucose, UA: NEGATIVE
Ketones, UA: NEGATIVE
Leukocytes,UA: NEGATIVE
Nitrite, UA: NEGATIVE
Protein,UA: NEGATIVE
Specific Gravity, UA: 1.025 (ref 1.005–1.030)
Urobilinogen, Ur: 0.2 mg/dL (ref 0.2–1.0)
pH, UA: 5.5 (ref 5.0–7.5)

## 2021-12-09 LAB — MICROSCOPIC EXAMINATION
Bacteria, UA: NONE SEEN
RBC, Urine: NONE SEEN /hpf (ref 0–2)
Renal Epithel, UA: NONE SEEN /hpf
WBC, UA: NONE SEEN /hpf (ref 0–5)

## 2021-12-09 NOTE — Progress Notes (Signed)
Jacqueline Andrade is a 41 y.o. female presents to office today for annual physical exam examination.    Concerns today include: none today  Occupation: Merchant navy officer at [employer] Marital status: separated Substance use: none Diet: working on making healthier choices; no longer eats fried food; does have a good variety; tries to drink mostly water Exercise: no regular exercise Last eye exam: earlier 2023 Last dental exam: not sure Last colonoscopy: never Last mammogram: 03/04/21 Last pap smear: 11/26/20 Refills needed today: no Immunizations needed: Flu Vaccine: no, declined  Tdap Vaccine: no, current    History reviewed. No pertinent past medical history. Social History   Socioeconomic History   Marital status: Married    Spouse name: Not on file   Number of children: Not on file   Years of education: Not on file   Highest education level: Not on file  Occupational History   Not on file  Tobacco Use   Smoking status: Never   Smokeless tobacco: Never  Vaping Use   Vaping Use: Never used  Substance and Sexual Activity   Alcohol use: Yes    Comment: rare   Drug use: No   Sexual activity: Yes    Birth control/protection: Surgical  Other Topics Concern   Not on file  Social History Narrative   Not on file   Social Determinants of Health   Financial Resource Strain: Not on file  Food Insecurity: Not on file  Transportation Needs: Not on file  Physical Activity: Not on file  Stress: Not on file  Social Connections: Not on file  Intimate Partner Violence: Not on file   Past Surgical History:  Procedure Laterality Date   ANKLE SURGERY Right    BUNIONECTOMY Bilateral    CHOLECYSTECTOMY OPEN     KNEE SURGERY Right    TUBAL LIGATION     Family History  Problem Relation Age of Onset   Diabetes Mother    Hypertension Mother    Hyperlipidemia Mother    Glaucoma Mother    Diabetes Father    Hyperlipidemia Father    Glaucoma Father    Glaucoma Other    Breast cancer  Neg Hx     Current Outpatient Medications:    chlorhexidine (HIBICLENS) 4 % external liquid, Apply topically daily as needed., Disp: 946 mL, Rfl: 11   [employer] ANTISEPTIC SKIN CLEANSER 4 % SOLN, Apply topically daily as needed., Disp: , Rfl:    diclofenac (VOLTAREN) 75 MG EC tablet, TAKE 1 TABLET BY MOUTH TWICE A DAY, Disp: 60 tablet, Rfl: 0   diclofenac sodium (VOLTAREN) 1 % GEL, Apply topically 4 (four) times daily., Disp: , Rfl:    dicyclomine (BENTYL) 20 MG tablet, TAKE 1 TABLET (20 MG TOTAL) BY MOUTH 4 (FOUR) TIMES DAILY - BEFORE MEALS AND AT BEDTIME., Disp: 120 tablet, Rfl: 1   doxycycline (ADOXA) 100 MG tablet, Take 100 mg by mouth 2 (two) times daily., Disp: , Rfl:    lidocaine (XYLOCAINE) 5 % ointment, Apply 1 Application topically as needed., Disp: 35.44 g, Rfl: 0  No Known Allergies   ROS: Review of Systems A comprehensive review of systems was negative except for: Integument/breast: positive for skin lesion(s) on genital area (known HS)   Pt also c/o vaginal discharge  Physical exam BP 125/86   Pulse 97   Temp 98.5 F (36.9 C)   Ht _0  (1.676 m)   Wt 188 lb 6.4 oz (85.5 kg)   SpO2 97%   BMI  30.41 kg/m  General appearance: alert, cooperative, appears stated age, and no distress Head: Normocephalic, without obvious abnormality, atraumatic Eyes: conjunctivae/corneas clear. PERRL, EOM's intact. Fundi benign. Ears: normal TM's and external ear canals both ears Nose: Nares normal. Septum midline. Mucosa normal. No drainage or sinus tenderness. Throat: lips, mucosa, and tongue normal; teeth and gums normal Neck: no adenopathy, no carotid bruit, no JVD, supple, symmetrical, trachea midline, and thyroid not enlarged, symmetric, no tenderness/mass/nodules Lungs: clear to auscultation bilaterally Heart: regular rate and rhythm, S1, S2 normal, no murmur, click, rub or gallop Abdomen: soft, non-tender; bowel sounds normal; no masses,  no organomegaly Extremities: extremities  normal, atraumatic, no cyanosis or edema Pulses: 2+ and symmetric Skin:  multiple draining HS lesions noted to genital area Lymph nodes: Cervical, supraclavicular, and axillary nodes normal. Neurologic: Alert and oriented X 3, normal strength and tone. Normal symmetric reflexes. Normal coordination and gait    Assessment/ Plan: Jacqueline Andrade was seen today for annual exam.  Diagnoses and all orders for this visit:  Annual physical exam -     CMP14+EGFR -     CBC with Differential/Platelet -     Lipid panel -     Thyroid Panel With TSH -     Urine cytology ancillary only -     MM 3D SCREEN BREAST BILATERAL -     Urinalysis, Routine w reflex microscopic  Screening for lipid disorders -     Lipid panel  Screening for deficiency anemia -     CBC with Differential/Platelet  Routine screening for STI (sexually transmitted infection) -     Urine cytology ancillary only  Screening for endocrine disorder -     CMP14+EGFR -     Thyroid Panel With TSH  Encounter for screening mammogram for malignant neoplasm of breast -     MM 3D SCREEN BREAST BILATERAL     Pt c/o vaginal discharge; urine cytology sent; further treatment pending results. Mammogram ordered. Pt being followed by Dermatology for HS; recommended yearly vaccines especially if she is started on Humira.    Counseled on healthy lifestyle choices, including diet (rich in fruits, vegetables and lean meats and low in salt and simple carbohydrates) and exercise (at least 30 minutes of moderate physical activity daily).  Patient to follow up in 1 year for annual exam or sooner if needed.  The above assessment and management plan was discussed with the patient. The patient verbalized understanding of and has agreed to the management plan. Patient is aware to call the clinic if symptoms persist or worsen. Patient is aware when to return to the clinic for a follow-up visit. Patient educated on when it is appropriate to go to the  emergency department.   Collene Leyden, NP Student  I personally was present during the history, physical exam, and medical decision-making activities of this visit and have verified that the services and findings are accurately documented in the nurse practitioner student's note.  Monia Pouch, FNP-C [facility] [address] [place], [place] [address] [phone]

## 2021-12-10 LAB — CBC WITH DIFFERENTIAL/PLATELET
Basophils Absolute: 0 10*3/uL (ref 0.0–0.2)
Basos: 0 %
EOS (ABSOLUTE): 0.1 10*3/uL (ref 0.0–0.4)
Eos: 1 %
Hematocrit: 36.4 % (ref 34.0–46.6)
Hemoglobin: 11.9 g/dL (ref 11.1–15.9)
Immature Grans (Abs): 0 10*3/uL (ref 0.0–0.1)
Immature Granulocytes: 0 %
Lymphocytes Absolute: 2.3 10*3/uL (ref 0.7–3.1)
Lymphs: 24 %
MCH: 26.5 pg — ABNORMAL LOW (ref 26.6–33.0)
MCHC: 32.7 g/dL (ref 31.5–35.7)
MCV: 81 fL (ref 79–97)
Monocytes Absolute: 0.5 10*3/uL (ref 0.1–0.9)
Monocytes: 5 %
Neutrophils Absolute: 6.5 10*3/uL (ref 1.4–7.0)
Neutrophils: 70 %
Platelets: 415 10*3/uL (ref 150–450)
RBC: 4.49 x10E6/uL (ref 3.77–5.28)
RDW: 14.6 % (ref 11.7–15.4)
WBC: 9.5 10*3/uL (ref 3.4–10.8)

## 2021-12-10 LAB — LIPID PANEL
Chol/HDL Ratio: 4.3 ratio (ref 0.0–4.4)
Cholesterol, Total: 208 mg/dL — ABNORMAL HIGH (ref 100–199)
HDL: 48 mg/dL (ref >39)
LDL Chol Calc (NIH): 115 mg/dL — ABNORMAL HIGH (ref 0–99)
Triglycerides: 257 mg/dL — ABNORMAL HIGH (ref 0–149)
VLDL Cholesterol Cal: 45 mg/dL — ABNORMAL HIGH (ref 5–40)

## 2021-12-10 LAB — THYROID PANEL WITH TSH
Free Thyroxine Index: 2 (ref 1.2–4.9)
T3 Uptake Ratio: 23 % — ABNORMAL LOW (ref 24–39)
T4, Total: 8.8 ug/dL (ref 4.5–12.0)
TSH: 2.26 u[IU]/mL (ref 0.450–4.500)

## 2021-12-10 LAB — CMP14+EGFR
ALT: 15 IU/L (ref 0–32)
AST: 18 IU/L (ref 0–40)
Albumin/Globulin Ratio: 1 — ABNORMAL LOW (ref 1.2–2.2)
Albumin: 4 g/dL (ref 3.9–4.9)
Alkaline Phosphatase: 116 IU/L (ref 44–121)
BUN/Creatinine Ratio: 20 (ref 9–23)
BUN: 12 mg/dL (ref 6–24)
Bilirubin Total: 0.3 mg/dL (ref 0.0–1.2)
CO2: 22 mmol/L (ref 20–29)
Calcium: 9.5 mg/dL (ref 8.7–10.2)
Chloride: 101 mmol/L (ref 96–106)
Creatinine, Ser: 0.61 mg/dL (ref 0.57–1.00)
Globulin, Total: 3.9 g/dL (ref 1.5–4.5)
Glucose: 99 mg/dL (ref 70–99)
Potassium: 4.1 mmol/L (ref 3.5–5.2)
Sodium: 137 mmol/L (ref 134–144)
Total Protein: 7.9 g/dL (ref 6.0–8.5)
eGFR: 115 mL/min/{1.73_m2} (ref >59)

## 2021-12-15 LAB — URINE CYTOLOGY ANCILLARY ONLY
Candida Urine: POSITIVE — AB
Chlamydia: NEGATIVE
Comment: NEGATIVE
Comment: NEGATIVE
Comment: NORMAL
Neisseria Gonorrhea: NEGATIVE
Trichomonas: NEGATIVE

## 2021-12-15 MED ORDER — FLUCONAZOLE 150 MG PO TABS: ORAL_TABLET | ORAL | 0 refills | Status: DC

## 2021-12-15 NOTE — Addendum Note (Signed)
Addended by: Sonny Masters on: 12/15/2021 10:53 AM   Modules accepted: Orders

## 2021-12-17 ENCOUNTER — Other Ambulatory Visit: Payer: Self-pay | Admitting: Family Medicine

## 2021-12-17 DIAGNOSIS — K580 Irritable bowel syndrome with diarrhea: Secondary | ICD-10-CM

## 2021-12-17 DIAGNOSIS — R197 Diarrhea, unspecified: Secondary | ICD-10-CM

## 2022-02-01 ENCOUNTER — Other Ambulatory Visit: Payer: Self-pay | Admitting: Family

## 2022-02-01 DIAGNOSIS — L732 Hidradenitis suppurativa: Secondary | ICD-10-CM

## 2022-02-10 ENCOUNTER — Other Ambulatory Visit: Payer: Self-pay | Admitting: Family Medicine

## 2022-02-10 ENCOUNTER — Other Ambulatory Visit: Payer: Self-pay | Admitting: Family

## 2022-02-10 DIAGNOSIS — L732 Hidradenitis suppurativa: Secondary | ICD-10-CM

## 2022-03-01 ENCOUNTER — Other Ambulatory Visit: Payer: Self-pay | Admitting: Family Medicine

## 2022-03-01 DIAGNOSIS — Z1231 Encounter for screening mammogram for malignant neoplasm of breast: Secondary | ICD-10-CM

## 2022-03-03 ENCOUNTER — Encounter: Payer: Self-pay | Admitting: Family Medicine

## 2022-03-03 ENCOUNTER — Ambulatory Visit (INDEPENDENT_AMBULATORY_CARE_PROVIDER_SITE_OTHER): Payer: 59

## 2022-03-03 ENCOUNTER — Ambulatory Visit: Payer: 59 | Admitting: Family Medicine

## 2022-03-03 VITALS — BP 142/96 | HR 76 | Temp 97.5°F | Ht 66.0 in | Wt 192.8 lb

## 2022-03-03 DIAGNOSIS — M25571 Pain in right ankle and joints of right foot: Secondary | ICD-10-CM | POA: Diagnosis not present

## 2022-03-03 DIAGNOSIS — M25562 Pain in left knee: Secondary | ICD-10-CM | POA: Diagnosis not present

## 2022-03-03 DIAGNOSIS — G8929 Other chronic pain: Secondary | ICD-10-CM

## 2022-03-03 MED ORDER — PREDNISONE 20 MG PO TABS: 40.0000 mg | ORAL_TABLET | Freq: Every day | ORAL | 0 refills | Status: AC
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
## 2022-03-08 ENCOUNTER — Inpatient Hospital Stay: Admission: RE | Admit: 2022-03-08 | Payer: 59 | Source: Ambulatory Visit

## 2022-03-15 ENCOUNTER — Telehealth: Payer: 59 | Admitting: Physician Assistant

## 2022-03-15 DIAGNOSIS — K047 Periapical abscess without sinus: Secondary | ICD-10-CM

## 2022-03-15 MED ORDER — IBUPROFEN 600 MG PO TABS: 600.0000 mg | ORAL_TABLET | Freq: Three times a day (TID) | ORAL | 0 refills | Status: DC | PRN

## 2022-03-15 MED ORDER — PENICILLIN V POTASSIUM 500 MG PO TABS: 500.0000 mg | ORAL_TABLET | Freq: Three times a day (TID) | ORAL | 0 refills | Status: AC

## 2022-03-15 NOTE — Progress Notes (Signed)
E-Visit for Dental Pain  We are sorry that you are not feeling well.  Here is how we plan to help!  Based on what you have shared with me in the questionnaire, it sounds like you have a dental infection  Pen VK 500mg 3 times a day for 7 days and Ibuprofen 600mg 3 times a day for 7 days for discomfort  It is imperative that you see a dentist within 10 days of this eVisit to determine the cause of the dental pain and be sure it is adequately treated  A toothache or tooth pain is caused when the nerve in the root of a tooth or surrounding a tooth is irritated. Dental (tooth) infection, decay, injury, or loss of a tooth are the most common causes of dental pain. Pain may also occur after an extraction (tooth is pulled out). Pain sometimes originates from other areas and radiates to the jaw, thus appearing to be tooth pain.Bacteria growing inside your mouth can contribute to gum disease and dental decay, both of which can cause pain. A toothache occurs from inflammation of the central portion of the tooth called pulp. The pulp contains nerve endings that are very sensitive to pain. Inflammation to the pulp or pulpitis may be caused by dental cavities, trauma, and infection.    HOME CARE:   For toothaches: Over-the-counter pain medications such as acetaminophen or ibuprofen may be used. Take these as directed on the package while you arrange for a dental appointment. Avoid very cold or hot foods, because they may make the pain worse. You may get relief from biting on a cotton ball soaked in oil of cloves. You can get oil of cloves at most drug stores.  For jaw pain:  Aspirin may be helpful for problems in the joint of the jaw in adults. If pain happens every time you open your mouth widely, the temporomandibular joint (TMJ) may be the source of the pain. Yawning or taking a large bite of food may worsen the pain. An appointment with your doctor or dentist will help you find the cause.     GET  HELP RIGHT AWAY IF:  You have a high fever or chills If you have had a recent head or face injury and develop headache, light headedness, nausea, vomiting, or other symptoms that concern you after an injury to your face or mouth, you could have a more serious injury in addition to your dental injury. A facial rash associated with a toothache: This condition may improve with medication. Contact your doctor for them to decide what is appropriate. Any jaw pain occurring with chest pain: Although jaw pain is most commonly caused by dental disease, it is sometimes referred pain from other areas. People with heart disease, especially people who have had stents placed, people with diabetes, or those who have had heart surgery may have jaw pain as a symptom of heart attack or angina. If your jaw or tooth pain is associated with lightheadedness, sweating, or shortness of breath, you should see a doctor as soon as possible. Trouble swallowing or excessive pain or bleeding from gums: If you have a history of a weakened immune system, diabetes, or steroid use, you may be more susceptible to infections. Infections can often be more severe and extensive or caused by unusual organisms. Dental and gum infections in people with these conditions may require more aggressive treatment. An abscess may need draining or IV antibiotics, for example.  MAKE SURE YOU   Understand   these instructions. Will watch your condition. Will get help right away if you are not doing well or get worse.  Thank you for choosing an e-visit.  Your e-visit answers were reviewed by a board certified advanced clinical practitioner to complete your personal care plan. Depending upon the condition, your plan could have included both over the counter or prescription medications.  Please review your pharmacy choice. Make sure the pharmacy is open so you can pick up prescription now. If there is a problem, you may contact your provider through  MyChart messaging and have the prescription routed to another pharmacy.  Your safety is important to us. If you have drug allergies check your prescription carefully.   For the next 24 hours you can use MyChart to ask questions about today's visit, request a non-urgent call back, or ask for a work or school excuse. You will get an email in the next two days asking about your experience. I hope that your e-visit has been valuable and will speed your recovery.  I have spent 5 minutes in review of e-visit questionnaire, review and updating patient chart, medical decision making and response to patient.   Zyier Dykema M Ranika Mcniel, PA-C   

## 2022-04-22 ENCOUNTER — Ambulatory Visit: Payer: 59 | Admitting: Family Medicine

## 2022-04-27 ENCOUNTER — Encounter: Payer: Self-pay | Admitting: Family Medicine

## 2022-04-27 ENCOUNTER — Ambulatory Visit: Payer: 59 | Admitting: Family Medicine

## 2022-04-27 VITALS — BP 134/92 | HR 73 | Temp 98.0°F | Ht 66.0 in | Wt 190.6 lb

## 2022-04-27 DIAGNOSIS — N926 Irregular menstruation, unspecified: Secondary | ICD-10-CM | POA: Diagnosis not present

## 2022-04-27 DIAGNOSIS — L732 Hidradenitis suppurativa: Secondary | ICD-10-CM

## 2022-04-27 NOTE — Progress Notes (Signed)
Subjective:  Patient ID: Jacqueline Andrade, female    DOB: 08/15/1980, 42 y.o.   MRN: [id number]  Patient Care Team: Sonny Masters, FNP as PCP - General (Family Medicine)   Chief Complaint:  Hidradenitis suppurativa (Vaginal area )   HPI: Jacqueline Andrade is a 42 y.o. female presenting on 04/27/2022 for Hidradenitis suppurativa (Vaginal area )   1. Hidradenitis suppurativa Pt reports worsening perineal lesions with increased pain and drainage. She is currently on doxycycline 100 mg twice daily. She has been soaking in epsom salt and using topical lidocaine to help with pain relief. States over the last several weeks the lesions are getting worse and has started to spread laterally to her abdomen. She has seen dermatology and biologic agents were discussed. She has not started these.   2. Irregular menses States over the last 4-5 months her cycles have been irregular and heavy at times. No chance of pregnancy. States at times it will be early and at other times late.      Relevant past medical, surgical, family, and social history reviewed and updated as indicated.  Allergies and medications reviewed and updated. Data reviewed: Chart in Epic.   History reviewed. No pertinent past medical history.  Past Surgical History:  Procedure Laterality Date   ANKLE SURGERY Right    BUNIONECTOMY Bilateral    CHOLECYSTECTOMY OPEN     KNEE SURGERY Right    TUBAL LIGATION      Social History   Socioeconomic History   Marital status: Married    Spouse name: Not on file   Number of children: Not on file   Years of education: Not on file   Highest education level: Not on file  Occupational History   Not on file  Tobacco Use   Smoking status: Never   Smokeless tobacco: Never  Vaping Use   Vaping Use: Never used  Substance and Sexual Activity   Alcohol use: Yes    Comment: rare   Drug use: No   Sexual activity: Yes    Birth control/protection: Surgical  Other Topics Concern    Not on file  Social History Narrative   Not on file   Social Determinants of Health   Financial Resource Strain: Not on file  Food Insecurity: Not on file  Transportation Needs: Not on file  Physical Activity: Not on file  Stress: Not on file  Social Connections: Not on file  Intimate Partner Violence: Not on file    Outpatient Encounter Medications as of 04/27/2022  Medication Sig   Chlorhexidine Gluconate ([employer] ANTISEPTIC SKIN CLEANSER) 4 % SOLN APPLY TO AFFECTED AREA EVERY DAY AS NEEDED   [employer] ANTISEPTIC SKIN CLEANSER 4 % SOLN Apply topically daily as needed.   diclofenac (VOLTAREN) 75 MG EC tablet TAKE 1 TABLET BY MOUTH TWICE A DAY   diclofenac sodium (VOLTAREN) 1 % GEL Apply topically 4 (four) times daily.   dicyclomine (BENTYL) 20 MG tablet TAKE 1 TABLET (20 MG TOTAL) BY MOUTH 4 (FOUR) TIMES DAILY - BEFORE MEALS AND AT BEDTIME.   doxycycline (ADOXA) 100 MG tablet Take 100 mg by mouth 2 (two) times daily.   ibuprofen (ADVIL) 600 MG tablet Take 1 tablet (600 mg total) by mouth every 8 (eight) hours as needed.   [DISCONTINUED] doxycycline (ADOXA) 100 MG tablet Take 100 mg by mouth 2 (two) times daily.   No facility-administered encounter medications on file as of 04/27/2022.    No Known Allergies  Review of Systems  Constitutional:  Negative for activity change, appetite change, chills, diaphoresis, fatigue, fever and unexpected weight change.  HENT: Negative.    Eyes: Negative.  Negative for photophobia and visual disturbance.  Respiratory:  Negative for cough, chest tightness and shortness of breath.   Cardiovascular:  Negative for chest pain, palpitations and leg swelling.  Gastrointestinal:  Negative for abdominal pain, blood in stool, constipation, diarrhea, nausea and vomiting.  Endocrine: Negative.   Genitourinary:  Positive for menstrual problem. Negative for decreased urine volume, difficulty urinating, dysuria, frequency and urgency.  Musculoskeletal:  Negative for  arthralgias and myalgias.  Skin:  Positive for color change, rash and wound.  Allergic/Immunologic: Negative.   Neurological:  Negative for dizziness and headaches.  Hematological: Negative.   Psychiatric/Behavioral:  Negative for confusion, hallucinations, sleep disturbance and suicidal ideas.   All other systems reviewed and are negative.       Objective:  BP (!) 134/92   Pulse 73   Temp 98 F (36.7 C) (Temporal)   Ht 5\' 6"  (1.676 m)   Wt 190 lb 9.6 oz (86.5 kg)   SpO2 93%   BMI 30.76 kg/m    Wt Readings from Last 3 Encounters:  04/27/22 190 lb 9.6 oz (86.5 kg)  03/03/22 192 lb 12.8 oz (87.5 kg)  12/09/21 188 lb 6.4 oz (85.5 kg)    Physical Exam Vitals and nursing note reviewed.  Constitutional:      General: She is not in acute distress.    Appearance: Normal appearance. She is obese. She is not ill-appearing, toxic-appearing or diaphoretic.  HENT:     Head: Normocephalic and atraumatic.     Mouth/Throat:     Mouth: Mucous membranes are moist.  Eyes:     Pupils: Pupils are equal, round, and reactive to light.  Cardiovascular:     Rate and Rhythm: Normal rate and regular rhythm.  Pulmonary:     Effort: Pulmonary effort is normal.     Breath sounds: Normal breath sounds.  Musculoskeletal:     Right lower leg: No edema.     Left lower leg: No edema.  Skin:    General: Skin is warm and dry.     Capillary Refill: Capillary refill takes less than 2 seconds.     Findings: Abscess, erythema, lesion and rash present. Rash is nodular and papular.     Comments: Perineum area: multiple inflamed papules and nodules, bridged scars overlying sinus tracts, several healed lesions, several, large open, and draining abscesses  Neurological:     General: No focal deficit present.     Mental Status: She is alert and oriented to person, place, and time.  Psychiatric:        Mood and Affect: Mood normal.        Behavior: Behavior normal.        Thought Content: Thought content  normal.        Judgment: Judgment normal.     Results for orders placed or performed in visit on 12/09/21  Microscopic Examination   Urine  Result Value Ref Range   WBC, UA None seen 0 - 5 /hpf   RBC, Urine None seen 0 - 2 /hpf   Epithelial Cells (non renal) 0-10 0 - 10 /hpf   Renal Epithel, UA None seen None seen /hpf   Bacteria, UA None seen None seen/Few  CMP14+EGFR  Result Value Ref Range   Glucose 99 70 - 99 mg/dL   BUN 12 6 -  24 mg/dL   Creatinine, Ser 1.30 0.57 - 1.00 mg/dL   eGFR 865 >78 IO/NGE/9.52   BUN/Creatinine Ratio 20 9 - 23   Sodium 137 134 - 144 mmol/L   Potassium 4.1 3.5 - 5.2 mmol/L   Chloride 101 96 - 106 mmol/L   CO2 22 20 - 29 mmol/L   Calcium 9.5 8.7 - 10.2 mg/dL   Total Protein 7.9 6.0 - 8.5 g/dL   Albumin 4.0 3.9 - 4.9 g/dL   Globulin, Total 3.9 1.5 - 4.5 g/dL   Albumin/Globulin Ratio 1.0 (L) 1.2 - 2.2   Bilirubin Total 0.3 0.0 - 1.2 mg/dL   Alkaline Phosphatase 116 44 - 121 IU/L   AST 18 0 - 40 IU/L   ALT 15 0 - 32 IU/L  CBC with Differential/Platelet  Result Value Ref Range   WBC 9.5 3.4 - 10.8 x10E3/uL   RBC 4.49 3.77 - 5.28 x10E6/uL   Hemoglobin 11.9 11.1 - 15.9 g/dL   Hematocrit 84.1 32.4 - 46.6 %   MCV 81 79 - 97 fL   MCH 26.5 (L) 26.6 - 33.0 pg   MCHC 32.7 31.5 - 35.7 g/dL   RDW 40.1 02.7 - 25.3 %   Platelets 415 150 - 450 x10E3/uL   Neutrophils 70 Not Estab. %   Lymphs 24 Not Estab. %   Monocytes 5 Not Estab. %   Eos 1 Not Estab. %   Basos 0 Not Estab. %   Neutrophils Absolute 6.5 1.4 - 7.0 x10E3/uL   Lymphocytes Absolute 2.3 0.7 - 3.1 x10E3/uL   Monocytes Absolute 0.5 0.1 - 0.9 x10E3/uL   EOS (ABSOLUTE) 0.1 0.0 - 0.4 x10E3/uL   Basophils Absolute 0.0 0.0 - 0.2 x10E3/uL   Immature Granulocytes 0 Not Estab. %   Immature Grans (Abs) 0.0 0.0 - 0.1 x10E3/uL  Lipid panel  Result Value Ref Range   Cholesterol, Total 208 (H) 100 - 199 mg/dL   Triglycerides 664 (H) 0 - 149 mg/dL   HDL 48 >40 mg/dL   VLDL Cholesterol Cal 45 (H) 5 -  40 mg/dL   LDL Chol Calc (NIH) 347 (H) 0 - 99 mg/dL   Chol/HDL Ratio 4.3 0.0 - 4.4 ratio  Thyroid Panel With TSH  Result Value Ref Range   TSH 2.260 0.450 - 4.500 uIU/mL   T4, Total 8.8 4.5 - 12.0 ug/dL   T3 Uptake Ratio 23 (L) 24 - 39 %   Free Thyroxine Index 2.0 1.2 - 4.9  Urinalysis, Routine w reflex microscopic  Result Value Ref Range   Specific Gravity, UA 1.025 1.005 - 1.030   pH, UA 5.5 5.0 - 7.5   Color, UA Yellow Yellow   Appearance Ur Cloudy (A) Clear   Leukocytes,UA Negative Negative   Protein,UA Negative Negative/Trace   Glucose, UA Negative Negative   Ketones, UA Negative Negative   RBC, UA Trace (A) Negative   Bilirubin, UA Negative Negative   Urobilinogen, Ur 0.2 0.2 - 1.0 mg/dL   Nitrite, UA Negative Negative   Microscopic Examination See below:   Urine cytology ancillary only  Result Value Ref Range   Neisseria Gonorrhea Negative    Chlamydia Negative    Trichomonas Negative    Candida Urine Positive (A)    Molecular Comment      For tests bacteria and/or candida, this specimen does not meet the   Molecular Comment      strict criteria set by the FDA. The result interpretation should be  Molecular Comment      considered in conjunction with the patient's clinical history.   Comment Normal Reference Range Trichomonas - Negative    Comment Normal Reference Ranger Chlamydia - Negative    Comment      Normal Reference Range Neisseria Gonorrhea - Negative       Pertinent labs & imaging results that were available during my care of the patient were reviewed by me and considered in my medical decision making.  Assessment & Plan:  Khushboo was seen today for hidradenitis suppurativa.  Diagnoses and all orders for this visit:  Hidradenitis suppurativa Is currently on doxycycline. Has topical lidocaine to use as needed for pain control. Worsening of symptoms, aware to make follow up with dermatology. Aware she needs to consider biologic therapies discussed at  dermatology appointment as symptoms are progressing.   Irregular menses Heavy and irregular cycles. Will check anemia profile and hormone panel. Further treatment pending results.  -     Hormone Panel -     Anemia Profile B     Continue all other maintenance medications.  Follow up plan: Return if symptoms worsen or fail to improve.   Continue healthy lifestyle choices, including diet (rich in fruits, vegetables, and lean proteins, and low in salt and simple carbohydrates) and exercise (at least 30 minutes of moderate physical activity daily).  Educational handout given for HS  The above assessment and management plan was discussed with the patient. The patient verbalized understanding of and has agreed to the management plan. Patient is aware to call the clinic if they develop any new symptoms or if symptoms persist or worsen. Patient is aware when to return to the clinic for a follow-up visit. Patient educated on when it is appropriate to go to the emergency department.   Kari Baars, FNP-C [facility] [phone]

## 2022-05-11 LAB — HORMONE PANEL (T4,TSH,FSH,TESTT,SHBG,DHEA,ETC)
DHEA-Sulfate, LCMS: 62 ug/dL
Estradiol, Serum, MS: 17 pg/mL
Estrone Sulfate: 43 ng/dL
Follicle Stimulating Hormone: 7 m[IU]/mL
Free T-3: 2.6 pg/mL
Free Testosterone, Serum: 0.8 pg/mL — ABNORMAL LOW
Progesterone, Serum: 42 ng/dL
Sex Hormone Binding Globulin: 56.7 nmol/L
T4: 8.3 ug/dL
TSH: 0.98 uU/mL
Testosterone, Serum (Total): 12 ng/dL
Testosterone-% Free: 0.7 %
Triiodothyronine (T-3), Serum: 106 ng/dL

## 2022-05-11 LAB — ANEMIA PROFILE B
Basophils Absolute: 0 10*3/uL (ref 0.0–0.2)
Basos: 1 %
EOS (ABSOLUTE): 0.1 10*3/uL (ref 0.0–0.4)
Eos: 2 %
Ferritin: 36 ng/mL (ref 15–150)
Folate: 17.4 ng/mL (ref >3.0)
Hematocrit: 35.5 % (ref 34.0–46.6)
Hemoglobin: 11.3 g/dL (ref 11.1–15.9)
Immature Grans (Abs): 0 10*3/uL (ref 0.0–0.1)
Immature Granulocytes: 0 %
Iron Saturation: 15 % (ref 15–55)
Iron: 53 ug/dL (ref 27–159)
Lymphocytes Absolute: 2 10*3/uL (ref 0.7–3.1)
Lymphs: 31 %
MCH: 26.8 pg (ref 26.6–33.0)
MCHC: 31.8 g/dL (ref 31.5–35.7)
MCV: 84 fL (ref 79–97)
Monocytes Absolute: 0.4 10*3/uL (ref 0.1–0.9)
Monocytes: 6 %
Neutrophils Absolute: 3.9 10*3/uL (ref 1.4–7.0)
Neutrophils: 60 %
Platelets: 347 10*3/uL (ref 150–450)
RBC: 4.22 x10E6/uL (ref 3.77–5.28)
RDW: 14.6 % (ref 11.7–15.4)
Retic Ct Pct: 1.8 % (ref 0.6–2.6)
Total Iron Binding Capacity: 359 ug/dL (ref 250–450)
UIBC: 306 ug/dL (ref 131–425)
Vitamin B-12: 324 pg/mL (ref 232–1245)
WBC: 6.4 10*3/uL (ref 3.4–10.8)

## 2022-07-14 ENCOUNTER — Encounter (HOSPITAL_BASED_OUTPATIENT_CLINIC_OR_DEPARTMENT_OTHER): Payer: Self-pay

## 2022-07-14 ENCOUNTER — Emergency Department (HOSPITAL_BASED_OUTPATIENT_CLINIC_OR_DEPARTMENT_OTHER): Payer: 59 | Admitting: Radiology

## 2022-07-14 ENCOUNTER — Emergency Department (HOSPITAL_BASED_OUTPATIENT_CLINIC_OR_DEPARTMENT_OTHER)
Admission: EM | Admit: 2022-07-14 | Discharge: 2022-07-14 | Disposition: A | Discharge status: Home / Self Care | Payer: No Typology Code available for payment source | Attending: Emergency Medicine | Admitting: Emergency Medicine

## 2022-07-14 ENCOUNTER — Other Ambulatory Visit: Payer: Self-pay

## 2022-07-14 ENCOUNTER — Other Ambulatory Visit (HOSPITAL_BASED_OUTPATIENT_CLINIC_OR_DEPARTMENT_OTHER): Payer: Self-pay

## 2022-07-14 DIAGNOSIS — W1843XA Slipping, tripping and stumbling without falling due to stepping from one level to another, initial encounter: Secondary | ICD-10-CM | POA: Insufficient documentation

## 2022-07-14 DIAGNOSIS — S93402A Sprain of unspecified ligament of left ankle, initial encounter: Secondary | ICD-10-CM

## 2022-07-14 DIAGNOSIS — Y990 Civilian activity done for income or pay: Secondary | ICD-10-CM | POA: Diagnosis not present

## 2022-07-14 DIAGNOSIS — S92102A Unspecified fracture of left talus, initial encounter for closed fracture: Secondary | ICD-10-CM | POA: Diagnosis not present

## 2022-07-14 DIAGNOSIS — S99912A Unspecified injury of left ankle, initial encounter: Secondary | ICD-10-CM | POA: Diagnosis present

## 2022-07-14 MED ORDER — HYDROCODONE-ACETAMINOPHEN 5-325 MG PO TABS
1.0000 | ORAL_TABLET | Freq: Once | ORAL | Status: AC
  Administered 2022-07-14: 1 via ORAL
  Filled 2022-07-14: qty 1

## 2022-07-14 MED ORDER — ONDANSETRON HCL 4 MG/2ML IJ SOLN
4.0000 mg | Freq: Once | INTRAMUSCULAR | Status: AC
  Administered 2022-07-14: 4 mg via INTRAVENOUS
  Filled 2022-07-14: qty 2

## 2022-07-14 MED ORDER — HYDROCODONE-ACETAMINOPHEN 5-325 MG PO TABS
1.0000 | ORAL_TABLET | Freq: Four times a day (QID) | ORAL | 0 refills | Status: DC | PRN
  Filled 2022-07-14: qty 10, 2d supply, fill #0

## 2022-07-14 MED ORDER — KETOROLAC TROMETHAMINE 60 MG/2ML IM SOLN: 30.0000 mg | Freq: Once | INTRAMUSCULAR | Status: DC

## 2022-07-14 MED ORDER — FENTANYL CITRATE PF 50 MCG/ML IJ SOSY
50.0000 ug | PREFILLED_SYRINGE | INTRAMUSCULAR | Status: DC | PRN
  Administered 2022-07-14: 50 ug via INTRAVENOUS
  Filled 2022-07-14 (×2): qty 1

## 2022-07-14 MED ORDER — KETOROLAC TROMETHAMINE 15 MG/ML IJ SOLN
15.0000 mg | Freq: Once | INTRAMUSCULAR | Status: AC
  Administered 2022-07-14: 15 mg via INTRAVENOUS
  Filled 2022-07-14: qty 1

## 2022-07-14 NOTE — Discharge Instructions (Addendum)
Your x-ray imaging revealed a possible small avulsion fracture of your talus.  You also have a likely ankle sprain.  Do not bear weight until follow-up, utilize a cam boot and crutches. Norco has been provided for pain control.  Follow-up outpatient with orthopedics in 1 week's time.  Recommend light duty until you can be cleared by an outpatient orthopedist.

## 2022-07-14 NOTE — ED Provider Notes (Addendum)
[place] EMERGENCY DEPARTMENT AT [facility] Provider Note   CSN: [id number] Arrival date & time: 07/14/22  9629     History  Chief Complaint  Patient presents with   Ankle Pain    Jacqueline Andrade is a 42 y.o. female.   Ankle Pain    42 year old female presenting to the department with ankle pain.  The patient states that she was stepping down after a ladder and rolled her ankle.  She felt a pop in her ankle and has had severe pain along the lateral aspect of her left ankle.  She has been unable to bear weight since the injury.  She endorses significant swelling to the lateral aspect of the ankle.  She denies any other injuries, denies any head trauma.  Home Medications Prior to Admission medications   Medication Sig Start Date End Date Taking? Authorizing Provider  Chlorhexidine Gluconate ([employer] ANTISEPTIC SKIN CLEANSER) 4 % SOLN APPLY TO AFFECTED AREA EVERY DAY AS NEEDED 02/10/22   Rakes, Doralee Albino, FNP  [employer] ANTISEPTIC SKIN CLEANSER 4 % SOLN Apply topically daily as needed. 08/10/21   [provider]  diclofenac (VOLTAREN) 75 MG EC tablet TAKE 1 TABLET BY MOUTH TWICE A DAY 12/02/21   Sonny Masters, FNP  diclofenac sodium (VOLTAREN) 1 % GEL Apply topically 4 (four) times daily.    [provider]  dicyclomine (BENTYL) 20 MG tablet TAKE 1 TABLET (20 MG TOTAL) BY MOUTH 4 (FOUR) TIMES DAILY - BEFORE MEALS AND AT BEDTIME. 12/17/21   Rakes, Doralee Albino, FNP  doxycycline (ADOXA) 100 MG tablet Take 100 mg by mouth 2 (two) times daily.    [provider]  ibuprofen (ADVIL) 600 MG tablet Take 1 tablet (600 mg total) by mouth every 8 (eight) hours as needed. 03/15/22   Margaretann Loveless, PA-C      Allergies    Patient has no known allergies.    Review of Systems   Review of Systems  Musculoskeletal:  Positive for arthralgias and joint swelling.  All other systems reviewed and are negative.   Physical Exam Updated Vital Signs BP 133/80 (BP Location:  Left Arm)   Pulse 78   Temp 99.2 F (37.3 C) (Oral)   Ht 5\' 6"  (1.676 m)   Wt 89.8 kg   LMP 06/27/2022 (Approximate)   SpO2 99%   BMI 31.96 kg/m  Physical Exam Vitals and nursing note reviewed.  Constitutional:      General: She is not in acute distress. HENT:     Head: Normocephalic and atraumatic.  Eyes:     Conjunctiva/sclera: Conjunctivae normal.     Pupils: Pupils are equal, round, and reactive to light.  Cardiovascular:     Rate and Rhythm: Normal rate and regular rhythm.  Pulmonary:     Effort: Pulmonary effort is normal. No respiratory distress.  Abdominal:     General: There is no distension.     Tenderness: There is no guarding.  Musculoskeletal:        General: Swelling, tenderness and signs of injury present. No deformity.     Cervical back: Neck supple.     Comments: Sniffing and swelling about the lateral malleolus, tenderness to palpation, no medial malleolus tenderness, 2+ DP pulses on the left.  Skin:    Findings: No lesion or rash.  Neurological:     General: No focal deficit present.     Mental Status: She is alert. Mental status is at baseline.  ED Results / Procedures / Treatments   Labs (all labs ordered are listed, but only abnormal results are displayed) Labs Reviewed - No data to display  EKG None  Radiology DG Ankle Complete Left  Result Date: 07/14/2022 CLINICAL DATA:  Left ankle pain after rolling ankle while stepping off ladder. Swelling and pain to the lateral left ankle EXAM: LEFT ANKLE COMPLETE - 3+ VIEW COMPARISON:  None available FINDINGS: No fracture or dislocation. Small lucency in the lateral talar dome suspicious for osteochondral injury, likely remote. Small ossific fragment noted adjacent to the lateral aspect of the talus, best seen on the AP view. This is suspicious for an avulsion fracture. Mild soft tissue swelling overlying the lateral malleolus. IMPRESSION: Findings suspicious for small avulsion fracture of the lateral  talus. Electronically Signed   By: Acquanetta Belling M.D.   On: 07/14/2022 10:13    Procedures Procedures    Medications Ordered in ED Medications  HYDROcodone-acetaminophen (NORCO/VICODIN) 5-325 MG per tablet 1 tablet (has no administration in time range)  ondansetron (ZOFRAN) injection 4 mg (4 mg Intravenous Given 07/14/22 0938)  ketorolac (TORADOL) 15 MG/ML injection 15 mg (15 mg Intravenous Given 07/14/22 1016)    ED Course/ Medical Decision Making/ A&P                             Medical Decision Making Amount and/or Complexity of Data Reviewed Radiology: ordered.  Risk Prescription drug management.     42 year old female presenting to the department with ankle pain.  The patient states that she was stepping down after a ladder and rolled her ankle.  She felt a pop in her ankle and has had severe pain along the lateral aspect of her left ankle.  She has been unable to bear weight since the injury.  She endorses significant swelling to the lateral aspect of the ankle.  She denies any other injuries, denies any head trauma.  On arrival, the patient was vitally stable, presenting with concern for ankle fracture versus ankle sprain.  Neurovascularly intact with 2+ pulses in the distal left foot.  The patient was provided with fentanyl and Zofran for pain and nausea control.   DG Left Ankle: FINDINGS:  No fracture or dislocation.    Small lucency in the lateral talar dome suspicious for osteochondral  injury, likely remote.    Small ossific fragment noted adjacent to the lateral aspect of the  talus, best seen on the AP view. This is suspicious for an avulsion  fracture.    Mild soft tissue swelling overlying the lateral malleolus.    IMPRESSION:  Findings suspicious for small avulsion fracture of the lateral  talus.    Patient was placed in a cam walking boot and provided crutches. She was advised to be non-weightbearing to the foot.  She was administered fentanyl,  Toradol, Zofran for pain control.  She was advised outpatient follow-up with orthopedics in 1 week.   Final Clinical Impression(s) / ED Diagnoses Final diagnoses:  Sprain of left ankle, unspecified ligament, initial encounter  Closed nondisplaced fracture of left talus, unspecified portion of talus, initial encounter    Rx / DC Orders ED Discharge Orders          Ordered    Ambulatory referral to Orthopedics        07/14/22 1040              Ernie Avena, MD 07/14/22 1041  Ernie Avena, MD 07/14/22 1114

## 2022-07-14 NOTE — ED Triage Notes (Signed)
Pt POV from work c/o left ankle pain after stepping off of a ladder and her ankle rolling. Pt states she heard a popping sound. Pt in obvious discomfort. Swelling noted to ankle, no deformity or bruising noted currently

## 2022-07-16 ENCOUNTER — Other Ambulatory Visit (HOSPITAL_BASED_OUTPATIENT_CLINIC_OR_DEPARTMENT_OTHER): Payer: Self-pay

## 2022-07-27 ENCOUNTER — Ambulatory Visit: Payer: 59 | Admitting: Family Medicine

## 2022-07-27 ENCOUNTER — Other Ambulatory Visit: Payer: Self-pay | Admitting: Family Medicine

## 2022-07-27 ENCOUNTER — Telehealth: Payer: Self-pay | Admitting: Family Medicine

## 2022-07-27 ENCOUNTER — Encounter: Payer: Self-pay | Admitting: Family Medicine

## 2022-07-27 VITALS — BP 161/120 | HR 89 | Temp 97.8°F | Ht 66.0 in | Wt 192.8 lb

## 2022-07-27 DIAGNOSIS — L732 Hidradenitis suppurativa: Secondary | ICD-10-CM | POA: Diagnosis not present

## 2022-07-27 DIAGNOSIS — S93492D Sprain of other ligament of left ankle, subsequent encounter: Secondary | ICD-10-CM | POA: Diagnosis not present

## 2022-07-27 NOTE — Progress Notes (Signed)
Subjective:  Patient ID: Jacqueline Andrade, female    DOB: [birth year], 42 y.o.   MRN: [id number]  Patient Care Team: Sonny Masters, FNP as PCP - General (Family Medicine)   Chief Complaint:  Follow-up (07/14/2022 (2 hours)/[place] [facility]- sprain left ankle )   HPI: Jacqueline Andrade is a 42 y.o. female presenting on 07/27/2022 for Follow-up (07/14/2022 (2 hours)/[place] [facility] at [facility]- sprain left ankle )   Ankle Pain  The incident occurred at work. The injury mechanism was an inversion injury. The pain is present in the left ankle. The quality of the pain is described as aching and burning. The pain is moderate. The pain has been Fluctuating since onset. Associated symptoms include an inability to bear weight. Pertinent negatives include no loss of motion, loss of sensation, muscle weakness, numbness or tingling. She reports no foreign bodies present. The symptoms are aggravated by movement, palpation and weight bearing. She has tried acetaminophen, elevation, ice, immobilization, non-weight bearing, rest and NSAIDs for the symptoms. The treatment provided mild relief.    Hidradenitis suppurativa Pt reports worsening perineal lesions with increased pain and drainage. She is currently on doxycycline 100 mg twice daily. She has been soaking in epsom salt and using topical lidocaine to help with pain relief. States over the last several weeks the lesions are getting worse and has started to spread laterally to her abdomen. She has seen dermatology and biologic agents were discussed. She has not started these.    Pt unstable on crutches and wearing brace because of previous injury to other ankle and lower extremity and subsequent surgery with screws, plates and tibial rod. Pain 6/10 with ROM. 1/10 with rest. Ambulation and standing 3-7 of 10 depending on how long on feet.passive inversiona and eversion.  Relevant past  medical, surgical, family, and social history reviewed and updated as indicated.  Allergies and medications reviewed and updated. Data reviewed: Chart in Epic.   History reviewed. No pertinent past medical history.  Past Surgical History:  Procedure Laterality Date   ANKLE SURGERY Right    BUNIONECTOMY Bilateral    CHOLECYSTECTOMY OPEN     KNEE SURGERY Right    TUBAL LIGATION      Social History   Socioeconomic History   Marital status: Significant Other    Spouse name: Not on file   Number of children: Not on file   Years of education: Not on file   Highest education level: Not on file  Occupational History   Not on file  Tobacco Use   Smoking status: Never   Smokeless tobacco: Never  Vaping Use   Vaping status: Never Used  Substance and Sexual Activity   Alcohol use: Yes    Comment: rare   Drug use: No   Sexual activity: Yes    Birth control/protection: Surgical  Other Topics Concern   Not on file  Social History Narrative   Not on file   Social Determinants of Health   Financial Resource Strain: Not on file  Food Insecurity: Not on file  Transportation Needs: Not on file  Physical Activity: Not on file  Stress: Not on file  Social Connections: Not on file  Intimate Partner Violence: Not on file    Outpatient Encounter Medications as of 07/27/2022  Medication Sig   Chlorhexidine Gluconate ([employer] ANTISEPTIC SKIN CLEANSER) 4 % SOLN APPLY TO AFFECTED AREA EVERY DAY AS NEEDED   [employer] ANTISEPTIC SKIN  CLEANSER 4 % SOLN Apply topically daily as needed.   diclofenac (VOLTAREN) 75 MG EC tablet TAKE 1 TABLET BY MOUTH TWICE A DAY   diclofenac sodium (VOLTAREN) 1 % GEL Apply topically 4 (four) times daily.   dicyclomine (BENTYL) 20 MG tablet TAKE 1 TABLET (20 MG TOTAL) BY MOUTH 4 (FOUR) TIMES DAILY - BEFORE MEALS AND AT BEDTIME.   doxycycline (ADOXA) 100 MG tablet Take 100 mg by mouth 2 (two) times daily.   HYDROcodone-acetaminophen (NORCO/VICODIN) 5-325 MG tablet Take  1-2 tablets by mouth every 6 (six) hours as needed.   ibuprofen (ADVIL) 600 MG tablet Take 1 tablet (600 mg total) by mouth every 8 (eight) hours as needed.   No facility-administered encounter medications on file as of 07/27/2022.    No Known Allergies  Review of Systems  Constitutional: Negative.  Negative for activity change, appetite change, chills, fatigue and fever.  HENT: Negative.    Eyes: Negative.   Respiratory:  Negative for cough, chest tightness and shortness of breath.   Cardiovascular:  Negative for chest pain, palpitations and leg swelling.  Gastrointestinal:  Negative for abdominal pain, blood in stool, constipation, diarrhea, nausea and vomiting.  Endocrine: Negative.   Genitourinary:  Negative for dysuria, frequency and urgency.  Musculoskeletal:  Positive for arthralgias, gait problem, joint swelling and myalgias.  Skin:  Positive for color change and wound.  Allergic/Immunologic: Negative.   Neurological:  Negative for dizziness, tingling, tremors, seizures, syncope, facial asymmetry, speech difficulty, weakness, light-headedness, numbness and headaches.  Hematological: Negative.   Psychiatric/Behavioral:  Negative for confusion, hallucinations, sleep disturbance and suicidal ideas.   All other systems reviewed and are negative.       Objective:  BP (!) 161/120   Pulse 89   Temp 97.8 F (36.6 C) (Temporal)   Ht 5\' 6"  (1.676 m)   Wt 192 lb 12.8 oz (87.5 kg)   LMP 06/27/2022 (Approximate)   SpO2 99%   BMI 31.12 kg/m    Wt Readings from Last 3 Encounters:  07/27/22 192 lb 12.8 oz (87.5 kg)  07/14/22 198 lb (89.8 kg)  04/27/22 190 lb 9.6 oz (86.5 kg)    Physical Exam Vitals and nursing note reviewed.  Constitutional:      General: She is not in acute distress.    Appearance: Normal appearance. She is well-developed and well-groomed. She is obese. She is not ill-appearing, toxic-appearing or diaphoretic.  HENT:     Head: Normocephalic and atraumatic.      Jaw: There is normal jaw occlusion.     Right Ear: Hearing normal.     Left Ear: Hearing normal.     Nose: Nose normal.     Mouth/Throat:     Lips: Pink.     Mouth: Mucous membranes are moist.     Pharynx: Uvula midline.  Eyes:     General: Lids are normal.     Conjunctiva/sclera: Conjunctivae normal.     Pupils: Pupils are equal, round, and reactive to light.  Neck:     Trachea: Trachea and phonation normal.  Cardiovascular:     Rate and Rhythm: Normal rate and regular rhythm.     Chest Wall: PMI is not displaced.     Pulses: Normal pulses.          Dorsalis pedis pulses are 2+ on the right side and 2+ on the left side.       Posterior tibial pulses are 2+ on the right side and 2+ on the  left side.     Heart sounds: Normal heart sounds. No murmur heard.    No friction rub. No gallop.  Pulmonary:     Effort: Pulmonary effort is normal.     Breath sounds: Normal breath sounds.  Abdominal:     General: There is no abdominal bruit.     Palpations: There is no hepatomegaly or splenomegaly.  Musculoskeletal:        General: Normal range of motion.     Cervical back: Normal range of motion and neck supple.     Left foot: Tenderness present.  Skin:    General: Skin is warm and dry.     Capillary Refill: Capillary refill takes less than 2 seconds.     Coloration: Skin is not cyanotic, jaundiced or pale.     Findings: No rash.  Neurological:     General: No focal deficit present.     Mental Status: She is alert and oriented to person, place, and time.     Sensory: Sensation is intact.     Motor: Motor function is intact.     Coordination: Coordination is intact.     Gait: Gait is intact.     Deep Tendon Reflexes: Reflexes are normal and symmetric.  Psychiatric:        Attention and Perception: Attention and perception normal.        Mood and Affect: Mood and affect normal.        Speech: Speech normal.        Behavior: Behavior normal. Behavior is cooperative.         Thought Content: Thought content normal.        Cognition and Memory: Cognition and memory normal.        Judgment: Judgment normal.   Foot/Ankle Musculoskeletal Exam Gait   Limp: left  Inspection   Leg length disparity: no discrepancy   Left     Erythema: none       Effusion: none       Edema: moderate       Ecchymosis: none       Deformity: none       Alignment: normal       Previous foot incision: no previous incision     Previous ankle incision: no previous incision  Palpation   Left      Increased warmth: none       Masses: none       Crepitus: none       Tenderness: present    Range of Motion   Right     Active Dorsiflexion: 20 (old injury with tibila rod)     Passive Dorsiflexion: 5     Active Plantar Flexion: 10     Passive Plantar Flexion: 10     Active Inversion: 5     Passive Inversion: 5     Active Eversion: 5     Passive Eversion: 5   Left     Active Dorsiflexion: 10     Passive Dorsiflexion: 10     Active Plantar Flexion: 15     Passive Plantar Flexion: 15     Active Inversion: 5 (tender)     Passive Inversion: 5 (grimace)     Active Eversion: 5     Passive Eversion: 5 (grimace)  Neurovascular   Right     Pulses - DP: normal     Dorsalis pedis: 2+     Pulses - PT: normal  Posterior tibial: 2+   Left     Pulses - DP: normal     Dorsalis pedis: 2+     Pulses - PT: normal     Posterior tibial: 2+  Results for orders placed or performed in visit on 04/27/22  Hormone Panel  Result Value Ref Range   TSH 0.98 uU/mL   T4 8.3 ug/dL   Free T-3 2.6 pg/mL   Triiodothyronine (T-3), Serum 106 ng/dL   Testosterone, Serum (Total) 12 ng/dL   Free Testosterone, Serum 0.8 (L) pg/mL   Testosterone-% Free 0.7 %   Follicle Stimulating Hormone 7.0 mIU/mL   Progesterone, Serum 42 ng/dL   DHEA-Sulfate, LCMS 62 ug/dL   Sex Hormone Binding Globulin 56.7 nmol/L   Estrone Sulfate 43 ng/dL   Estradiol, Serum, MS 17 pg/mL  Anemia Profile B  Result Value  Ref Range   Total Iron Binding Capacity 359 250 - 450 ug/dL   UIBC 409 811 - 914 ug/dL   Iron 53 27 - 782 ug/dL   Iron Saturation 15 15 - 55 %   Ferritin 36 15 - 150 ng/mL   Vitamin B-12 324 232 - 1,245 pg/mL   Folate 17.4 >3.0 ng/mL   WBC 6.4 3.4 - 10.8 x10E3/uL   RBC 4.22 3.77 - 5.28 x10E6/uL   Hemoglobin 11.3 11.1 - 15.9 g/dL   Hematocrit 95.6 21.3 - 46.6 %   MCV 84 79 - 97 fL   MCH 26.8 26.6 - 33.0 pg   MCHC 31.8 31.5 - 35.7 g/dL   RDW 08.6 57.8 - 46.9 %   Platelets 347 150 - 450 x10E3/uL   Neutrophils 60 Not Estab. %   Lymphs 31 Not Estab. %   Monocytes 6 Not Estab. %   Eos 2 Not Estab. %   Basos 1 Not Estab. %   Neutrophils Absolute 3.9 1.4 - 7.0 x10E3/uL   Lymphocytes Absolute 2.0 0.7 - 3.1 x10E3/uL   Monocytes Absolute 0.4 0.1 - 0.9 x10E3/uL   EOS (ABSOLUTE) 0.1 0.0 - 0.4 x10E3/uL   Basophils Absolute 0.0 0.0 - 0.2 x10E3/uL   Immature Granulocytes 0 Not Estab. %   Immature Grans (Abs) 0.0 0.0 - 0.1 x10E3/uL   Retic Ct Pct 1.8 0.6 - 2.6 %       Pertinent labs & imaging results that were available during my care of the patient were reviewed by me and considered in my medical decision making.  Assessment & Plan:  Lillymae was seen today for follow-up.  Diagnoses and all orders for this visit:  Sprain of other ligament of left ankle, subsequent encounter RICE discussed in detail. Follow up with ortho as referred.  -     Ambulatory referral to Orthopedic Surgery  Hidradenitis suppurativa Follow up with dermatology as referred.     Per previous xray avulsion fracture of talus. RICE- rest ice compression and elevation Continue diclofenac 75 mg daily with food. May add tylenol 100mg  PO q6h for mild to moderate pain not to exced 4g in 24hrs. Return to work letter given to patient.  Pt not being followed for HTN. Pt BP Pt to start BP log for elevated BP  taken in clinic.   Continue all other maintenance medications.    Follow up plan: Return if symptoms worsen  or fail to improve.   Continue healthy lifestyle choices, including diet (rich in fruits, vegetables, and lean proteins, and low in salt and simple carbohydrates) and exercise (at least 30 minutes of moderate physical  activity daily).  The above assessment and management plan was discussed with the patient. The patient verbalized understanding of and has agreed to the management plan. Patient is aware to call the clinic if they develop any new symptoms or if symptoms persist or worsen. Patient is aware when to return to the clinic for a follow-up visit. Patient educated on when it is appropriate to go to the emergency department.   Lennox Grumbles,  NP Student  [facility] [phone]   I personally was present during the history, physical exam, and medical decision-making activities of this visit and have verified that the services and findings are accurately documented in the nurse practitioner student's note.  Kari Baars, FNP-C [facility] [address] [place], [place] [address] [phone]

## 2022-07-28 ENCOUNTER — Other Ambulatory Visit: Payer: Self-pay | Admitting: Family Medicine

## 2022-07-28 MED ORDER — DOXYCYCLINE MONOHYDRATE 100 MG PO TABS: 100.0000 mg | ORAL_TABLET | Freq: Two times a day (BID) | ORAL | 0 refills | Status: AC

## 2022-07-28 NOTE — Telephone Encounter (Signed)
New letter written and left up front for patient pick up. Patient notified and verbalized understanding

## 2022-07-29 MED ORDER — DICLOFENAC SODIUM 75 MG PO TBEC
75.0000 mg | DELAYED_RELEASE_TABLET | Freq: Two times a day (BID) | ORAL | 0 refills | Status: AC
Start: 2022-07-29 — End: ?

## 2022-07-29 NOTE — Addendum Note (Signed)
Addended by: Sonny Masters on: 07/29/2022 12:38 PM   Modules accepted: Orders

## 2022-08-03 ENCOUNTER — Telehealth: Payer: Self-pay | Admitting: Family Medicine

## 2022-08-03 DIAGNOSIS — Z0279 Encounter for issue of other medical certificate: Secondary | ICD-10-CM

## 2022-08-03 NOTE — Telephone Encounter (Signed)
Jacqueline Andrade dropped off FMLA  forms to be completed and signed.  Form Fee Paid? (Y/N)       YES  If YES, then form will be placed in the RX/HH Nurse Coordinators box for completion.

## 2022-08-05 NOTE — Telephone Encounter (Signed)
PCP completed and signed FMLA forms. They have been faxed to [facility] at fax number [phone]. Patient's VM is full, there is a copy at the front desk for her.

## 2022-09-08 ENCOUNTER — Encounter: Payer: Self-pay | Admitting: Family Medicine

## 2022-09-13 ENCOUNTER — Encounter: Payer: Self-pay | Admitting: Family Medicine

## 2022-09-14 ENCOUNTER — Telehealth: Payer: Self-pay | Admitting: Family Medicine

## 2022-09-14 DIAGNOSIS — Z0279 Encounter for issue of other medical certificate: Secondary | ICD-10-CM

## 2022-09-14 NOTE — Telephone Encounter (Signed)
Information completed and forwarded to PCP 

## 2022-09-14 NOTE — Telephone Encounter (Signed)
Pt dropped off and paid for FMLA forms to be filled out. Will put in Cathys box. Pt aware that someone will call her when her copy is ready for pickup. Also wants it faxed to company.

## 2022-09-16 NOTE — Telephone Encounter (Signed)
PCP completed and signed FMLA forms. They have been faxed to [facility] at fax number [phone]. VM full unable to leave message that this has been completed, copy at front desk for her.

## 2022-09-17 ENCOUNTER — Ambulatory Visit: Payer: 59 | Admitting: Family Medicine

## 2022-11-15 ENCOUNTER — Ambulatory Visit: Payer: 59 | Admitting: Family

## 2022-11-15 ENCOUNTER — Encounter: Payer: Self-pay | Admitting: Family

## 2022-11-15 VITALS — BP 142/104 | HR 90 | Temp 97.5°F | Ht 66.0 in | Wt 193.0 lb

## 2022-11-15 DIAGNOSIS — H9311 Tinnitus, right ear: Secondary | ICD-10-CM

## 2022-11-15 DIAGNOSIS — R030 Elevated blood-pressure reading, without diagnosis of hypertension: Secondary | ICD-10-CM | POA: Diagnosis not present

## 2022-11-15 DIAGNOSIS — I10 Essential (primary) hypertension: Secondary | ICD-10-CM

## 2022-11-15 MED ORDER — LOSARTAN POTASSIUM 50 MG PO TABS
50.0000 mg | ORAL_TABLET | Freq: Every day | ORAL | 1 refills | Status: DC
Start: 2022-11-15 — End: 2023-05-18

## 2022-11-15 NOTE — Patient Instructions (Addendum)
Tinnitus Tinnitus is when you hear a sound that there's no actual source for. It may sound like ringing in your ears or something else. The sound may be loud, soft, or somewhere in between. It can last for a few seconds or be constant for days. It can come and go. Almost everyone has tinnitus at some point. It's not the same as hearing loss. But you may need to see a health care provider if: It lasts for a long time. It comes back often. You have trouble sleeping and focusing. What are the causes? The cause of tinnitus is often unknown. In some cases, you may get it if: You're around loud noises, such as from machines or music. An object gets stuck in your ear. Earwax builds up in [employer]. You drink a lot of alcohol or caffeine. You take certain medicines. You start to lose your hearing. You may also get it from some medical conditions. These may include: Ear or sinus infections. Heart diseases. High blood pressure. Allergies. Mnire's disease. Problems with your thyroid. A tumor. This is a growth of cells that isn't normal. A weak, bulging blood vessel called an aneurysm near your ear. What increases the risk? You may be more likely to get tinnitus if: You're around loud noises a lot. You're older. You drink alcohol. You smoke. What are the signs or symptoms? The main symptom is hearing a sound that there's no source for. It may sound like ringing. It may also sound like: Buzzing. Sizzling. Blowing air. Hissing. Whistling. Other sounds may include: Roaring. Running water. A musical note. Tapping. Humming. You may have symptoms in one ear or both ears. How is this diagnosed? Tinnitus is diagnosed based on your symptoms, your medical history, and an exam. Your provider may do a full hearing test if your tinnitus: Is in just one ear. Makes it hard for you to hear. Lasts 6 months or longer. You may also need to see an expert in hearing disorders called an audiologist.  They may ask you about your symptoms and how tinnitus affects your daily life. You may have other tests done. These may include: A CT scan. An MRI. An angiogram. This shows how blood flows through your blood vessels. How is this treated? Treatment may include: Therapy to help you manage the stress of living with tinnitus. Finding ways to mask or cover the sound of tinnitus. These include: Sound or white noise machines. Devices that fit in your ear and play sounds or music. A loud humidifier. Acoustic neural stimulation. This is when you use headphones to listen to music that has a special signal in it. Over time, this signal may change some of the pathways in your brain. This can make you less sensitive to tinnitus. This treatment is used for very severe cases. Using hearing aids or cochlear implants if your tinnitus is from hearing loss. If your tinnitus is caused by a medical condition, treating the condition may make it go away.  Follow these instructions at home: Managing symptoms     Try to avoid being in loud places or around loud noises. Wear earplugs or headphones when you're around loud noises. Find ways to reduce stress. These may include meditation, yoga, or deep breathing. Sleep with your head slightly raised. General instructions Take over-the-counter and prescription medicines only as told by your provider. Track the things that cause symptoms (triggers). Try to avoid these things. To stop your tinnitus from getting worse: Do not drink alcohol. Do  not have caffeine. Do not use any products that contain nicotine or tobacco. These products include cigarettes, chewing tobacco, and vaping devices, such as e-cigarettes. If you need help quitting, ask your provider. Avoid using too much salt. Get enough sleep each night. Where to find more information American [employer]inn[employer] Association: https://www.johnson-hamilton.org/ Contact a health care provider if: Your symptoms last for 3 weeks or longer without  stopping. You have sudden hearing loss. Your symptoms get worse or don't get better with home care. You can't manage the stress of living with tinnitus. Get help right away if: You get tinnitus after a head injury. You have tinnitus and: Dizziness. Nausea and vomiting. Loss of balance. A sudden, severe headache. Changes to your eyesight. Weakness in your face, arms, or legs. These symptoms may be an emergency. Get help right away. Call 911. Do not wait to see if the symptoms will go away. Do not drive yourself to the hospital. This information is not intended to replace advice given to you by your health care provider. Make sure you discuss any questions you have with your health care provider.   Hypertension, Adult High blood pressure (hypertension) is when the force of blood pumping through the arteries is too strong. The arteries are the blood vessels that carry blood from the heart throughout the body. Hypertension forces the heart to work harder to pump blood and may cause arteries to become narrow or stiff. Untreated or uncontrolled hypertension can lead to a heart attack, heart failure, a stroke, kidney disease, and other problems. A blood pressure reading consists of a higher number over a lower number. Ideally, your blood pressure should be below 120/80. The first ("top") number is called the systolic pressure. It is a measure of the pressure in your arteries as your heart beats. The second ("bottom") number is called the diastolic pressure. It is a measure of the pressure in your arteries as the heart relaxes. What are the causes? The exact cause of this condition is not known. There are some conditions that result in high blood pressure. What increases the risk? Certain factors may make you more likely to develop high blood pressure. Some of these risk factors are under your control, including: Smoking. Not getting enough exercise or physical activity. Being overweight. Having  too much fat, sugar, calories, or salt (sodium) in your diet. Drinking too much alcohol. Other risk factors include: Having a personal history of heart disease, diabetes, high cholesterol, or kidney disease. Stress. Having a family history of high blood pressure and high cholesterol. Having obstructive sleep apnea. Age. The risk increases with age. What are the signs or symptoms? High blood pressure may not cause symptoms. Very high blood pressure (hypertensive crisis) may cause: Headache. Fast or irregular heartbeats (palpitations). Shortness of breath. Nosebleed. Nausea and vomiting. Vision changes. Severe chest pain, dizziness, and seizures. How is this diagnosed? This condition is diagnosed by measuring your blood pressure while you are seated, with your arm resting on a flat surface, your legs uncrossed, and your feet flat on the floor. The cuff of the blood pressure monitor will be placed directly against the skin of your upper arm at the level of your heart. Blood pressure should be measured at least twice using the same arm. Certain conditions can cause a difference in blood pressure between your right and left arms. If you have a high blood pressure reading during one visit or you have normal blood pressure with other risk factors, you may be asked  to: Return on a different day to have your blood pressure checked again. Monitor your blood pressure at home for 1 week or longer. If you are diagnosed with hypertension, you may have other blood or imaging tests to help your health care provider understand your overall risk for other conditions. How is this treated? This condition is treated by making healthy lifestyle changes, such as eating healthy foods, exercising more, and reducing your alcohol intake. You may be referred for counseling on a healthy diet and physical activity. Your health care provider may prescribe medicine if lifestyle changes are not enough to get your blood  pressure under control and if: Your systolic blood pressure is above 130. Your diastolic blood pressure is above 80. Your personal target blood pressure may vary depending on your medical conditions, your age, and other factors. Follow these instructions at home: Eating and drinking  Eat a diet that is high in fiber and potassium, and low in sodium, added sugar, and fat. An example of this eating plan is called the DASH diet. DASH stands for Dietary Approaches to Stop Hypertension. To eat this way: Eat plenty of fresh fruits and vegetables. Try to fill one half of your plate at each meal with fruits and vegetables. Eat whole grains, such as whole-wheat pasta, brown rice, or whole-grain bread. Fill about one fourth of your plate with whole grains. Eat or drink low-fat dairy products, such as skim milk or low-fat yogurt. Avoid fatty cuts of meat, processed or cured meats, and poultry with skin. Fill about one fourth of your plate with lean proteins, such as fish, chicken without skin, beans, eggs, or tofu. Avoid pre-made and processed foods. These tend to be higher in sodium, added sugar, and fat. Reduce your daily sodium intake. Many people with hypertension should eat less than 1,500 mg of sodium a day. Do not drink alcohol if: Your health care provider tells you not to drink. You are pregnant, may be pregnant, or are planning to become pregnant. If you drink alcohol: Limit how much you have to: 0-1 drink a day for women. 0-2 drinks a day for men. Know how much alcohol is in your drink. In the [place] one drink equals one 12 oz bottle of beer (355 mL), one 5 oz glass of wine (148 mL), or one 1 oz glass of hard liquor (44 mL). Lifestyle  Work with your health care provider to maintain a healthy body weight or to lose weight. Ask what an ideal weight is for you. Get at least 30 minutes of exercise that causes your heart to beat faster (aerobic exercise) most days of the week. Activities may  include walking, swimming, or biking. Include exercise to strengthen your muscles (resistance exercise), such as Pilates or lifting weights, as part of your weekly exercise routine. Try to do these types of exercises for 30 minutes at least 3 days a week. Do not use any products that contain nicotine or tobacco. These products include cigarettes, chewing tobacco, and vaping devices, such as e-cigarettes. If you need help quitting, ask your health care provider. Monitor your blood pressure at home as told by your health care provider. Keep all follow-up visits. This is important. Medicines Take over-the-counter and prescription medicines only as told by your health care provider. Follow directions carefully. Blood pressure medicines must be taken as prescribed. Do not skip doses of blood pressure medicine. Doing this puts you at risk for problems and can make the medicine less effective. Ask your  health care provider about side effects or reactions to medicines that you should watch for. Contact a health care provider if you: Think you are having a reaction to a medicine you are taking. Have headaches that keep coming back (recurring). Feel dizzy. Have swelling in your ankles. Have trouble with your vision. Get help right away if you: Develop a severe headache or confusion. Have unusual weakness or numbness. Feel faint. Have severe pain in your chest or abdomen. Vomit repeatedly. Have trouble breathing. These symptoms may be an emergency. Get help right away. Call 911. Do not wait to see if the symptoms will go away. Do not drive yourself to the hospital. Summary Hypertension is when the force of blood pumping through your arteries is too strong. If this condition is not controlled, it may put you at risk for serious complications. Your personal target blood pressure may vary depending on your medical conditions, your age, and other factors. For most people, a normal blood pressure is less  than 120/80. Hypertension is treated with lifestyle changes, medicines, or a combination of both. Lifestyle changes include losing weight, eating a healthy, low-sodium diet, exercising more, and limiting alcohol. This information is not intended to replace advice given to you by your health care provider. Make sure you discuss any questions you have with your health care provider. Document Revised: 10/28/2020 Document Reviewed: 10/28/2020 Elsevier Patient Education  2024 [employer].  Document Revised: 03/29/2022 Document Reviewed: 03/29/2022 Elsevier Patient Education  2024 [employer].

## 2022-11-15 NOTE — Progress Notes (Signed)
Subjective:    Patient ID: Jacqueline Andrade, female    DOB: 06-11-1980, 42 y.o.   MRN: [id number]  Chief Complaint  Patient presents with   Hypertension    DIZZY OFF BALANCE    PT presents to the office today with elevated BP.  After reviewing her chart, the last three visits that have been acute visits her BP has been elevated.   Also, complaining of a blowing sound in right ear for the last three weeks.  Hypertension This is a new problem. The current episode started more than 1 year ago. The problem has been gradually worsening since onset. The problem is uncontrolled. Associated symptoms include headaches and malaise/fatigue. Pertinent negatives include no peripheral edema (has hx of ankle surgery and has chronic sweling in that ankle) or shortness of breath. Risk factors for coronary artery disease include obesity. Past treatments include nothing. The current treatment provides no improvement. There is no history of CVA, heart failure or retinopathy.      Review of Systems  Constitutional:  Positive for malaise/fatigue.  Respiratory:  Negative for shortness of breath.   Neurological:  Positive for headaches.  All other systems reviewed and are negative.      Objective:   Physical Exam Vitals reviewed.  Constitutional:      General: She is not in acute distress.    Appearance: She is well-developed.  HENT:     Head: Normocephalic and atraumatic.  Eyes:     Pupils: Pupils are equal, round, and reactive to light.  Neck:     Thyroid: No thyromegaly.  Cardiovascular:     Rate and Rhythm: Normal rate and regular rhythm.     Heart sounds: Normal heart sounds. No murmur heard. Pulmonary:     Effort: Pulmonary effort is normal. No respiratory distress.     Breath sounds: Normal breath sounds. No wheezing.  Abdominal:     General: Bowel sounds are normal. There is no distension.     Palpations: Abdomen is soft.     Tenderness: There is no abdominal tenderness.   Musculoskeletal:        General: No tenderness. Normal range of motion.     Cervical back: Normal range of motion and neck supple.  Skin:    General: Skin is warm and dry.  Neurological:     Mental Status: She is alert and oriented to person, place, and time.     Cranial Nerves: No cranial nerve deficit.     Deep Tendon Reflexes: Reflexes are normal and symmetric.  Psychiatric:        Behavior: Behavior normal.        Thought Content: Thought content normal.        Judgment: Judgment normal.       BP (!) 142/104   Pulse 90   Temp (!) 97.5 F (36.4 C) (Temporal)   Ht 5\' 6"  (1.676 m)   Wt 193 lb (87.5 kg)   SpO2 98%   BMI 31.15 kg/m      Assessment & Plan:  Jacqueline Andrade comes in today with chief complaint of Hypertension (DIZZY OFF BALANCE )   Diagnosis and orders addressed:  1. Elevated blood pressure reading  - losartan (COZAAR) 50 MG tablet; Take 1 tablet (50 mg total) by mouth daily.  Dispense: 90 tablet; Refill: 1 - BMP8+EGFR  2. Primary hypertension Start Losartan 50 mg  -Continue to monitor BP at home -[employer] information given -Exercise encouraged - Stress Management  -  Continue current meds -RTO in 2 weeks  - losartan (COZAAR) 50 MG tablet; Take 1 tablet (50 mg total) by mouth daily.  Dispense: 90 tablet; Refill: 1 - BMP8+EGFR  3. Tinnitus of right ear - BMP8+EGFR No infection present  Discussed this may come and go and there is no treatment    Labs pending Health Maintenance reviewed Diet and exercise encouraged  Follow up plan: 2 weeks with PCP   Jannifer Rodney, FNP

## 2022-11-16 LAB — BMP8+EGFR
BUN/Creatinine Ratio: 19 (ref 9–23)
BUN: 12 mg/dL (ref 6–24)
CO2: 22 mmol/L (ref 20–29)
Calcium: 9.1 mg/dL (ref 8.7–10.2)
Chloride: 101 mmol/L (ref 96–106)
Creatinine, Ser: 0.63 mg/dL (ref 0.57–1.00)
Glucose: 122 mg/dL — ABNORMAL HIGH (ref 70–99)
Potassium: 4.2 mmol/L (ref 3.5–5.2)
Sodium: 136 mmol/L (ref 134–144)
eGFR: 114 mL/min/{1.73_m2} (ref >59)

## 2022-12-07 ENCOUNTER — Ambulatory Visit: Payer: 59 | Admitting: Family Medicine

## 2022-12-07 ENCOUNTER — Encounter: Payer: Self-pay | Admitting: Family Medicine

## 2022-12-07 VITALS — BP 104/64 | HR 77 | Temp 97.9°F | Ht 66.0 in | Wt 193.0 lb

## 2022-12-07 DIAGNOSIS — L732 Hidradenitis suppurativa: Secondary | ICD-10-CM | POA: Diagnosis not present

## 2022-12-07 DIAGNOSIS — R7309 Other abnormal glucose: Secondary | ICD-10-CM | POA: Diagnosis not present

## 2022-12-07 DIAGNOSIS — I10 Essential (primary) hypertension: Secondary | ICD-10-CM | POA: Diagnosis not present

## 2022-12-07 DIAGNOSIS — H6593 Unspecified nonsuppurative otitis media, bilateral: Secondary | ICD-10-CM | POA: Diagnosis not present

## 2022-12-07 LAB — BAYER DCA HB A1C WAIVED: HB A1C (BAYER DCA - WAIVED): 5.5 % (ref 4.8–5.6)

## 2022-12-07 MED ORDER — FLUTICASONE PROPIONATE 50 MCG/ACT NA SUSP
2.0000 | Freq: Every day | NASAL | 6 refills | Status: DC
Start: 2022-12-07 — End: 2023-06-13

## 2022-12-07 MED ORDER — DOXYCYCLINE HYCLATE 100 MG PO TABS
100.0000 mg | ORAL_TABLET | Freq: Two times a day (BID) | ORAL | 6 refills | Status: AC
Start: 2022-12-07 — End: 2022-12-17

## 2022-12-07 NOTE — Patient Instructions (Signed)

## 2022-12-07 NOTE — Progress Notes (Signed)
Subjective:  Patient ID: Jacqueline Andrade, female    DOB: [birth year], 42 y.o.   MRN: [id number]  Patient Care Team: Sonny Masters, FNP as PCP - General (Family Medicine)   Chief Complaint:  Hypertension (2 week follow up - patient states that she is still having her headaches and right ear pain. )   HPI: Jacqueline Andrade is a 42 y.o. female presenting on 12/07/2022 for Hypertension (2 week follow up - patient states that she is still having her headaches and right ear pain. )   Discussed the use of AI scribe software for clinical note transcription with the patient, who gave verbal consent to proceed.  History of Present Illness   The patient, with a history of hypertension and tinnitus, reports persistent headaches despite recent initiation of antihypertensive medication. The frequency of headaches remains unchanged, however, the patient notes an improvement in associated dizziness and feelings of imbalance. The patient continues to experience tinnitus, which was previously explained as a potential long-term issue.  The patient has not yet begun monitoring her blood pressure at home. She recently started consuming caffeinated beverages, specifically Folgers cappuccinos, and acknowledges the need to reduce caffeine intake. The patient denies taking ibuprofen regularly, only resorting to it for severe pain and typically relies on over-the-counter medication. She is currently taking diclofenac (Voltaren) twice daily.  The patient also reports a flare-up of Hidradenitis Suppurativa (HS) that began the week prior to the consultation. The flare-up was severe enough to cause the patient significant discomfort, but began to drain and improve on the day of the consultation. The patient has previously been treated with doxycycline for HS flare-ups.          Relevant past medical, surgical, family, and social history reviewed and updated as indicated.  Allergies and medications reviewed and  updated. Data reviewed: Chart in Epic.   History reviewed. No pertinent past medical history.  Past Surgical History:  Procedure Laterality Date   ANKLE SURGERY Right    BUNIONECTOMY Bilateral    CHOLECYSTECTOMY OPEN     KNEE SURGERY Right    TUBAL LIGATION      Social History   Socioeconomic History   Marital status: Significant Other    Spouse name: Not on file   Number of children: Not on file   Years of education: Not on file   Highest education level: Not on file  Occupational History   Not on file  Tobacco Use   Smoking status: Never   Smokeless tobacco: Never  Vaping Use   Vaping status: Never Used  Substance and Sexual Activity   Alcohol use: Yes    Comment: rare   Drug use: No   Sexual activity: Yes    Birth control/protection: Surgical  Other Topics Concern   Not on file  Social History Narrative   Not on file   Social Determinants of Health   Financial Resource Strain: Not on file  Food Insecurity: Not on file  Transportation Needs: Not on file  Physical Activity: Not on file  Stress: Not on file  Social Connections: Not on file  Intimate Partner Violence: Not on file    Outpatient Encounter Medications as of 12/07/2022  Medication Sig   Chlorhexidine Gluconate ([employer] ANTISEPTIC SKIN CLEANSER) 4 % SOLN APPLY TO AFFECTED AREA EVERY DAY AS NEEDED   [employer] ANTISEPTIC SKIN CLEANSER 4 % SOLN Apply topically daily as needed.   diclofenac (VOLTAREN) 75 MG EC tablet Take 1 tablet (  75 mg total) by mouth 2 (two) times daily.   diclofenac sodium (VOLTAREN) 1 % GEL Apply topically 4 (four) times daily.   dicyclomine (BENTYL) 20 MG tablet TAKE 1 TABLET (20 MG TOTAL) BY MOUTH 4 (FOUR) TIMES DAILY - BEFORE MEALS AND AT BEDTIME.   doxycycline (VIBRA-TABS) 100 MG tablet Take 1 tablet (100 mg total) by mouth 2 (two) times daily for 10 days. 1 po bid   fluticasone (FLONASE) 50 MCG/ACT nasal spray Place 2 sprays into both nostrils daily.   HYDROcodone-acetaminophen  (NORCO/VICODIN) 5-325 MG tablet Take 1-2 tablets by mouth every 6 (six) hours as needed.   losartan (COZAAR) 50 MG tablet Take 1 tablet (50 mg total) by mouth daily.   [DISCONTINUED] ibuprofen (ADVIL) 600 MG tablet Take 1 tablet (600 mg total) by mouth every 8 (eight) hours as needed.   No facility-administered encounter medications on file as of 12/07/2022.    No Known Allergies  Pertinent ROS per HPI, otherwise unremarkable      Objective:  BP 104/64   Pulse 77   Temp 97.9 F (36.6 C) (Temporal)   Ht 5\' 6"  (1.676 m)   Wt 193 lb (87.5 kg)   SpO2 98%   BMI 31.15 kg/m    Wt Readings from Last 3 Encounters:  12/07/22 193 lb (87.5 kg)  11/15/22 193 lb (87.5 kg)  07/27/22 192 lb 12.8 oz (87.5 kg)    Physical Exam Vitals and nursing note reviewed.  Constitutional:      General: She is not in acute distress.    Appearance: Normal appearance. She is obese. She is not ill-appearing, toxic-appearing or diaphoretic.  HENT:     Head: Normocephalic and atraumatic.     Right Ear: A middle ear effusion is present. Tympanic membrane is not erythematous.     Left Ear: A middle ear effusion is present. Tympanic membrane is not erythematous.     Nose: Nose normal.     Mouth/Throat:     Mouth: Mucous membranes are moist.     Pharynx: Oropharynx is clear.  Eyes:     Conjunctiva/sclera: Conjunctivae normal.     Pupils: Pupils are equal, round, and reactive to light.  Cardiovascular:     Rate and Rhythm: Normal rate and regular rhythm.     Heart sounds: Normal heart sounds.  Pulmonary:     Effort: Pulmonary effort is normal.     Breath sounds: Normal breath sounds.  Musculoskeletal:     Cervical back: Neck supple.     Right lower leg: No edema.     Left lower leg: No edema.  Skin:    General: Skin is warm and dry.     Capillary Refill: Capillary refill takes less than 2 seconds.  Neurological:     General: No focal deficit present.     Mental Status: She is alert and oriented to  person, place, and time.     Cranial Nerves: No cranial nerve deficit.     Sensory: No sensory deficit.     Motor: No weakness.     Coordination: Coordination normal.     Gait: Gait normal.     Deep Tendon Reflexes: Reflexes normal.  Psychiatric:        Mood and Affect: Mood normal.        Behavior: Behavior normal.        Thought Content: Thought content normal.    Physical Exam   HEENT: Fluid behind ears.  Results for orders placed or performed in visit on 11/15/22  BMP8+EGFR  Result Value Ref Range   Glucose 122 (H) 70 - 99 mg/dL   BUN 12 6 - 24 mg/dL   Creatinine, Ser 6.29 0.57 - 1.00 mg/dL   eGFR 528 >41 LK/GMW/1.02   BUN/Creatinine Ratio 19 9 - 23   Sodium 136 134 - 144 mmol/L   Potassium 4.2 3.5 - 5.2 mmol/L   Chloride 101 96 - 106 mmol/L   CO2 22 20 - 29 mmol/L   Calcium 9.1 8.7 - 10.2 mg/dL       Pertinent labs & imaging results that were available during my care of the patient were reviewed by me and considered in my medical decision making.  Assessment & Plan:  Jacqueline Andrade was seen today for hypertension.  Diagnoses and all orders for this visit:  Primary hypertension -     BMP8+EGFR -     Thyroid Panel With TSH  Elevated glucose -     BMP8+EGFR -     Bayer DCA Hb A1c Waived  Bilateral otitis media with effusion -     fluticasone (FLONASE) 50 MCG/ACT nasal spray; Place 2 sprays into both nostrils daily.  Hidradenitis suppurativa -     doxycycline (VIBRA-TABS) 100 MG tablet; Take 1 tablet (100 mg total) by mouth 2 (two) times daily for 10 days. 1 po bid     Assessment and Plan    Hypertension Hypertension with ongoing headaches and dizziness. Reports improvement in dizziness and balance but persistent headaches and tinnitus. Discussed potential rebound headaches from caffeine and risks of combining NSAIDs. Emphasized monitoring renal function and thyroid levels due to potential impacts on blood pressure. - Monitor blood pressure regularly -  Reduce caffeine intake - Recheck renal function - Check A1c - Check thyroid function - Follow up in three months  Tinnitus Persistent tinnitus, likely related to hypertension. Fluid behind the ear noted on examination. Discussed that tinnitus may not completely resolve and can be intermittent. - Prescribe Flonase once daily for two weeks - Reassess in three months or sooner if symptoms worsen  Hidradenitis Suppurativa (HS) Flare-up of HS with recent drainage. Reports significant pain prior to drainage. Discussed the use of doxycycline to manage flare-ups. - Prescribe doxycycline 100 mg twice daily for 10 days - Monitor for changes or worsening symptoms  General Health Maintenance Annual physical due soon. Last physical was in December of the previous year. - Schedule next appointment for annual physical  Follow-up - Follow up in three months for blood pressure and headache assessment - Return sooner if symptoms worsen.          Continue all other maintenance medications.  Follow up plan: Return in about 3 months (around 03/07/2023), or if symptoms worsen or fail to improve, for chronic follow up.   Continue healthy lifestyle choices, including diet (rich in fruits, vegetables, and lean proteins, and low in salt and simple carbohydrates) and exercise (at least 30 minutes of moderate physical activity daily).  Educational handout given for DASH diet  The above assessment and management plan was discussed with the patient. The patient verbalized understanding of and has agreed to the management plan. Patient is aware to call the clinic if they develop any new symptoms or if symptoms persist or worsen. Patient is aware when to return to the clinic for a follow-up visit. Patient educated on when it is appropriate to go to the emergency department.   Kari Baars, FNP-C [facility] [phone]

## 2022-12-08 LAB — BMP8+EGFR
BUN/Creatinine Ratio: 24 — ABNORMAL HIGH (ref 9–23)
BUN: 14 mg/dL (ref 6–24)
CO2: 21 mmol/L (ref 20–29)
Calcium: 8.8 mg/dL (ref 8.7–10.2)
Chloride: 101 mmol/L (ref 96–106)
Creatinine, Ser: 0.59 mg/dL (ref 0.57–1.00)
Glucose: 114 mg/dL — ABNORMAL HIGH (ref 70–99)
Potassium: 4.2 mmol/L (ref 3.5–5.2)
Sodium: 139 mmol/L (ref 134–144)
eGFR: 115 mL/min/{1.73_m2} (ref >59)

## 2022-12-08 LAB — THYROID PANEL WITH TSH
Free Thyroxine Index: 1.5 (ref 1.2–4.9)
T3 Uptake Ratio: 19 % — ABNORMAL LOW (ref 24–39)
T4, Total: 8.1 ug/dL (ref 4.5–12.0)
TSH: 1.69 u[IU]/mL (ref 0.450–4.500)

## 2022-12-17 ENCOUNTER — Encounter: Payer: Self-pay | Admitting: *Deleted

## 2022-12-31 ENCOUNTER — Telehealth: Payer: 59 | Admitting: Emergency Medicine

## 2022-12-31 DIAGNOSIS — K0889 Other specified disorders of teeth and supporting structures: Secondary | ICD-10-CM | POA: Diagnosis not present

## 2022-12-31 MED ORDER — PENICILLIN V POTASSIUM 500 MG PO TABS: 500.0000 mg | ORAL_TABLET | Freq: Three times a day (TID) | ORAL | 0 refills | Status: AC

## 2022-12-31 NOTE — Progress Notes (Signed)
E-Visit for Dental Pain  We are sorry that you are not feeling well.  Here is how we plan to help!  Based on what you have shared with me in the questionnaire, it sounds like you might have a developing infection from a broken or cracked tooth.  Pen VK 500mg  3 times a day for 7 days  It is imperative that you see a dentist within 10 days of this eVisit to determine the cause of the dental pain and be sure it is adequately treated  A toothache or tooth pain is caused when the nerve in the root of a tooth or surrounding a tooth is irritated. Dental (tooth) infection, decay, injury, or loss of a tooth are the most common causes of dental pain. Pain may also occur after an extraction (tooth is pulled out). Pain sometimes originates from other areas and radiates to the jaw, thus appearing to be tooth pain.Bacteria growing inside your mouth can contribute to gum disease and dental decay, both of which can cause pain. A toothache occurs from inflammation of the central portion of the tooth called pulp. The pulp contains nerve endings that are very sensitive to pain. Inflammation to the pulp or pulpitis may be caused by dental cavities, trauma, and infection.    HOME CARE:   For toothaches: Over-the-counter pain medications such as acetaminophen or ibuprofen may be used. Take these as directed on the package while you arrange for a dental appointment. Avoid very cold or hot foods, because they may make the pain worse. You may get relief from biting on a cotton ball soaked in oil of cloves. You can get oil of cloves at most drug stores.  For jaw pain:  Aspirin may be helpful for problems in the joint of the jaw in adults. If pain happens every time you open your mouth widely, the temporomandibular joint (TMJ) may be the source of the pain. Yawning or taking a large bite of food may worsen the pain. An appointment with your doctor or dentist will help you find the cause.     GET HELP RIGHT AWAY  IF:  You have a high fever or chills If you have had a recent head or face injury and develop headache, light headedness, nausea, vomiting, or other symptoms that concern you after an injury to your face or mouth, you could have a more serious injury in addition to your dental injury. A facial rash associated with a toothache: This condition may improve with medication. Contact your doctor for them to decide what is appropriate. Any jaw pain occurring with chest pain: Although jaw pain is most commonly caused by dental disease, it is sometimes referred pain from other areas. People with heart disease, especially people who have had stents placed, people with diabetes, or those who have had heart surgery may have jaw pain as a symptom of heart attack or angina. If your jaw or tooth pain is associated with lightheadedness, sweating, or shortness of breath, you should see a doctor as soon as possible. Trouble swallowing or excessive pain or bleeding from gums: If you have a history of a weakened immune system, diabetes, or steroid use, you may be more susceptible to infections. Infections can often be more severe and extensive or caused by unusual organisms. Dental and gum infections in people with these conditions may require more aggressive treatment. An abscess may need draining or IV antibiotics, for example.  MAKE SURE YOU   Understand these instructions. Will watch your  condition. Will get help right away if you are not doing well or get worse.  Thank you for choosing an e-visit.  Your e-visit answers were reviewed by a board certified advanced clinical practitioner to complete your personal care plan. Depending upon the condition, your plan could have included both over the counter or prescription medications.  Please review your pharmacy choice. Make sure the pharmacy is open so you can pick up prescription now. If there is a problem, you may contact your provider through [employer] and  have the prescription routed to another pharmacy.  Your safety is important to [place]. If you have drug allergies check your prescription carefully.   For the next 24 hours you can use MyChart to ask questions about today's visit, request a non-urgent call back, or ask for a work or school excuse. You will get an email in the next two days asking about your experience. I hope that your e-visit has been valuable and will speed your recovery.  Approximately 5 minutes was used in reviewing the patient's chart, questionnaire, prescribing medications, and documentation.

## 2023-03-08 ENCOUNTER — Ambulatory Visit: Payer: 59 | Admitting: Family Medicine

## 2023-03-09 DIAGNOSIS — Z0279 Encounter for issue of other medical certificate: Secondary | ICD-10-CM

## 2023-03-10 ENCOUNTER — Encounter: Payer: Self-pay | Admitting: Family Medicine

## 2023-03-10 ENCOUNTER — Ambulatory Visit: Payer: 59 | Admitting: Family Medicine

## 2023-03-10 ENCOUNTER — Telehealth: Payer: Self-pay | Admitting: Family Medicine

## 2023-03-10 VITALS — BP 131/84 | HR 87 | Temp 98.1°F | Ht 66.0 in | Wt 195.8 lb

## 2023-03-10 DIAGNOSIS — E669 Obesity, unspecified: Secondary | ICD-10-CM | POA: Diagnosis not present

## 2023-03-10 DIAGNOSIS — R519 Headache, unspecified: Secondary | ICD-10-CM

## 2023-03-10 DIAGNOSIS — L732 Hidradenitis suppurativa: Secondary | ICD-10-CM

## 2023-03-10 DIAGNOSIS — E559 Vitamin D deficiency, unspecified: Secondary | ICD-10-CM

## 2023-03-10 DIAGNOSIS — I10 Essential (primary) hypertension: Secondary | ICD-10-CM

## 2023-03-10 MED ORDER — CLINDAMYCIN PHOSPHATE 1 % EX GEL
Freq: Two times a day (BID) | CUTANEOUS | 0 refills | Status: DC
Start: 2023-03-10 — End: 2023-05-10

## 2023-03-10 NOTE — Telephone Encounter (Signed)
 Jacqueline Andrade dropped off FMLA forms to be completed and signed.  Form Fee Paid? (Y/N)      y      If NO, form is placed on front office manager desk to hold until payment received. If YES, then form will be placed in the RX/HH Nurse Coordinators box for completion.  Form will not be processed until payment is received

## 2023-03-10 NOTE — Progress Notes (Signed)
 Subjective:  Patient ID: Jacqueline Andrade, female    DOB: 03-21-1980, 43 y.o.   MRN: [id number]  Patient Care Team: Sonny Masters, FNP as PCP - General (Family Medicine)   Chief Complaint:  Hidradenitis suppurativa and Headache (Right side of head and ear x 3 months daily )   HPI: Jacqueline Andrade is a 43 y.o. female presenting on 03/10/2023 for Hidradenitis suppurativa and Headache (Right side of head and ear x 3 months daily )   Discussed the use of AI scribe software for clinical note transcription with the patient, who gave verbal consent to proceed.  History of Present Illness   Jacqueline Andrade is a 43 year old female who presents with persistent right-sided headaches.  She has been experiencing persistent right-sided headaches since after Christmas. The pain is sharp and throbbing, originating at the base of the neck under the ear and radiating over the head. The severity of the headaches disrupts her daily activities, often forcing her to 'curl up and go to sleep' for relief. She takes three to four Excedrin tablets a couple of times a day, which only provides temporary numbness to the pain. A CT scan performed at the emergency room showed no abnormalities. She has no history of migraines and sometimes describes the pain as if her hair is hurting. No visual changes or other symptoms aside from the headache are reported.  She has ongoing issues with hidradenitis suppurativa, primarily affecting her thighs and buttocks, with symptoms worsening around her menstrual cycle. She manages the condition with clindamycin 1% solution and doxycycline 100 mg, along with chlorhexidine wash and weekly Epsom salt baths. Significant drainage from lesions is noted, and she uses cotton cloths in her underwear to manage this. Sitting for prolonged periods exacerbates her symptoms, leading to increased pain and inflammation.  Her blood pressure is well-controlled on losartan, which she has been  taking for about three months. No additional headaches, chest pain, or leg swelling related to her blood pressure.          Relevant past medical, surgical, family, and social history reviewed and updated as indicated.  Allergies and medications reviewed and updated. Data reviewed: Chart in Epic.   History reviewed. No pertinent past medical history.  Past Surgical History:  Procedure Laterality Date   ANKLE SURGERY Right    BUNIONECTOMY Bilateral    CHOLECYSTECTOMY OPEN     KNEE SURGERY Right    TUBAL LIGATION      Social History   Socioeconomic History   Marital status: Significant Other    Spouse name: Not on file   Number of children: Not on file   Years of education: Not on file   Highest education level: Not on file  Occupational History   Not on file  Tobacco Use   Smoking status: Never   Smokeless tobacco: Never  Vaping Use   Vaping status: Never Used  Substance and Sexual Activity   Alcohol use: Yes    Comment: rare   Drug use: No   Sexual activity: Yes    Birth control/protection: Surgical  Other Topics Concern   Not on file  Social History Narrative   Not on file   Social Drivers of Health   Financial Resource Strain: Not on file  Food Insecurity: Not on file  Transportation Needs: Not on file  Physical Activity: Not on file  Stress: Not on file  Social Connections: Not on file  Intimate Partner Violence:  Not on file    Outpatient Encounter Medications as of 03/10/2023  Medication Sig   Chlorhexidine Gluconate ([employer] ANTISEPTIC SKIN CLEANSER) 4 % SOLN APPLY TO AFFECTED AREA EVERY DAY AS NEEDED   clindamycin (CLINDAGEL) 1 % gel Apply topically 2 (two) times daily.   CVS ANTISEPTIC SKIN CLEANSER 4 % SOLN Apply topically daily as needed.   diclofenac (VOLTAREN) 75 MG EC tablet Take 1 tablet (75 mg total) by mouth 2 (two) times daily.   diclofenac sodium (VOLTAREN) 1 % GEL Apply topically 4 (four) times daily.   dicyclomine (BENTYL) 20 MG tablet  TAKE 1 TABLET (20 MG TOTAL) BY MOUTH 4 (FOUR) TIMES DAILY - BEFORE MEALS AND AT BEDTIME.   doxycycline (VIBRA-TABS) 100 MG tablet Take 100 mg by mouth 2 (two) times daily.   fluticasone (FLONASE) 50 MCG/ACT nasal spray Place 2 sprays into both nostrils daily.   losartan (COZAAR) 50 MG tablet Take 1 tablet (50 mg total) by mouth daily.   [DISCONTINUED] HYDROcodone-acetaminophen (NORCO/VICODIN) 5-325 MG tablet Take 1-2 tablets by mouth every 6 (six) hours as needed.   No facility-administered encounter medications on file as of 03/10/2023.    No Known Allergies  Pertinent ROS per HPI, otherwise unremarkable      Objective:  BP 131/84   Pulse 87   Temp 98.1 F (36.7 C)   Ht 5\' 6"  (1.676 m)   Wt 195 lb 12.8 oz (88.8 kg)   SpO2 97%   BMI 31.60 kg/m    Wt Readings from Last 3 Encounters:  03/10/23 195 lb 12.8 oz (88.8 kg)  12/07/22 193 lb (87.5 kg)  11/15/22 193 lb (87.5 kg)    Physical Exam Vitals and nursing note reviewed.  Constitutional:      General: She is not in acute distress.    Appearance: Normal appearance. She is obese. She is not ill-appearing, toxic-appearing or diaphoretic.  HENT:     Head: Normocephalic and atraumatic.     Nose: Nose normal.     Mouth/Throat:     Mouth: Mucous membranes are moist.  Eyes:     Extraocular Movements: Extraocular movements intact.     Conjunctiva/sclera: Conjunctivae normal.     Pupils: Pupils are equal, round, and reactive to light.  Cardiovascular:     Rate and Rhythm: Normal rate and regular rhythm.     Heart sounds: Normal heart sounds.  Pulmonary:     Effort: Pulmonary effort is normal.     Breath sounds: Normal breath sounds.  Musculoskeletal:     Right lower leg: No edema.     Left lower leg: No edema.  Skin:    General: Skin is warm and dry.     Capillary Refill: Capillary refill takes less than 2 seconds.  Neurological:     General: No focal deficit present.     Mental Status: She is alert and oriented to  person, place, and time.     Cranial Nerves: No cranial nerve deficit.     Sensory: No sensory deficit.     Motor: No weakness.     Coordination: Coordination normal.     Gait: Gait normal.     Deep Tendon Reflexes: Reflexes normal.  Psychiatric:        Mood and Affect: Mood normal.        Behavior: Behavior normal.        Thought Content: Thought content normal.        Judgment: Judgment normal.  Results for orders placed or performed in visit on 12/07/22  Bayer DCA Hb A1c Waived   Collection Time: 12/07/22 10:01 AM  Result Value Ref Range   HB A1C (BAYER DCA - WAIVED) 5.5 4.8 - 5.6 %  BMP8+EGFR   Collection Time: 12/07/22 10:04 AM  Result Value Ref Range   Glucose 114 (H) 70 - 99 mg/dL   BUN 14 6 - 24 mg/dL   Creatinine, Ser 1.61 0.57 - 1.00 mg/dL   eGFR 096 >04 VW/UJW/1.19   BUN/Creatinine Ratio 24 (H) 9 - 23   Sodium 139 134 - 144 mmol/L   Potassium 4.2 3.5 - 5.2 mmol/L   Chloride 101 96 - 106 mmol/L   CO2 21 20 - 29 mmol/L   Calcium 8.8 8.7 - 10.2 mg/dL  Thyroid Panel With TSH   Collection Time: 12/07/22 10:04 AM  Result Value Ref Range   TSH 1.690 0.450 - 4.500 uIU/mL   T4, Total 8.1 4.5 - 12.0 ug/dL   T3 Uptake Ratio 19 (L) 24 - 39 %   Free Thyroxine Index 1.5 1.2 - 4.9       Pertinent labs & imaging results that were available during my care of the patient were reviewed by me and considered in my medical decision making.  Assessment & Plan:  Annalei was seen today for hidradenitis suppurativa and headache.  Diagnoses and all orders for this visit:  Primary hypertension -     CBC with Differential/Platelet -     CMP14+EGFR -     Thyroid Panel With TSH  Hidradenitis suppurativa -     CBC with Differential/Platelet -     clindamycin (CLINDAGEL) 1 % gel; Apply topically 2 (two) times daily.  Obesity (BMI 30-39.9) -     CBC with Differential/Platelet -     CMP14+EGFR -     Thyroid Panel With TSH -     VITAMIN D 25 Hydroxy (Vit-D Deficiency,  Fractures)  Recurrent headache -     AMB referral to headache clinic -     CBC with Differential/Platelet -     CMP14+EGFR -     Thyroid Panel With TSH     Assessment and Plan    Chronic Headache Persistent headaches since December, characterized by sharp, throbbing pain on the right side of the scalp, starting at the base of the neck and radiating over the head. Severe episodes lead to curling up and sleeping. CT scan was normal, and migraines were suggested as a possible diagnosis. No history of migraines. Headaches are not relieved by over-the-counter medications like Excedrin Migraine, which she has been taking multiple times a day. Suspected rebound headaches due to NSAID use. - Provide headache log to identify triggers and patterns. - Refer to headache clinic for specialized evaluation. - Advise switching to Excedrin Tension (Tylenol and caffeine) to avoid rebound headaches. - Instruct to take no more than two Excedrin Tension at a time, up to three times a day, to avoid Tylenol overdose.  Hidradenitis Suppurativa (HS) Ongoing HS with lesions on the thigh and buttocks, exacerbated around menstrual cycle. Managed with clindamycin 1% solution, doxycycline, chlorhexidine wash, and Epsom salt baths. Condition worsens with prolonged sitting and possibly by period panties causing sweating and irritation. Needs dermatology follow-up but has financial constraints. - Prescribe clindamycin 1% solution. - Ensure daily use of doxycycline 100 mg. - Continue chlorhexidine wash and Epsom salt baths. - Advise minimizing prolonged sitting and using breathable underwear.  Hypertension On losartan for  three months with good blood pressure control. No associated symptoms like chest pain or leg swelling. Blood work needed to recheck renal function due to losartan use. - Order blood work to recheck renal function.          Continue all other maintenance medications.  Follow up plan: Return for  Annual Physical.   Continue healthy lifestyle choices, including diet (rich in fruits, vegetables, and lean proteins, and low in salt and simple carbohydrates) and exercise (at least 30 minutes of moderate physical activity daily).  Educational handout given for headache diary  The above assessment and management plan was discussed with the patient. The patient verbalized understanding of and has agreed to the management plan. Patient is aware to call the clinic if they develop any new symptoms or if symptoms persist or worsen. Patient is aware when to return to the clinic for a follow-up visit. Patient educated on when it is appropriate to go to the emergency department.   Kari Baars, FNP-C [facility] [phone]

## 2023-03-10 NOTE — Telephone Encounter (Signed)
 PCP completed and signed FMLA forms. They have been faxed to [facility] at fax number [phone]. Patient has been contacted and informed they are complete.

## 2023-03-11 LAB — CMP14+EGFR
ALT: 23 IU/L (ref 0–32)
AST: 21 IU/L (ref 0–40)
Albumin: 4 g/dL (ref 3.9–4.9)
Alkaline Phosphatase: 129 IU/L — ABNORMAL HIGH (ref 44–121)
BUN/Creatinine Ratio: 21 (ref 9–23)
BUN: 13 mg/dL (ref 6–24)
Bilirubin Total: 0.4 mg/dL (ref 0.0–1.2)
CO2: 21 mmol/L (ref 20–29)
Calcium: 9.6 mg/dL (ref 8.7–10.2)
Chloride: 104 mmol/L (ref 96–106)
Creatinine, Ser: 0.63 mg/dL (ref 0.57–1.00)
Globulin, Total: 4.4 g/dL (ref 1.5–4.5)
Glucose: 116 mg/dL — ABNORMAL HIGH (ref 70–99)
Potassium: 4.3 mmol/L (ref 3.5–5.2)
Sodium: 139 mmol/L (ref 134–144)
Total Protein: 8.4 g/dL (ref 6.0–8.5)
eGFR: 114 mL/min/{1.73_m2} (ref >59)

## 2023-03-11 LAB — THYROID PANEL WITH TSH
Free Thyroxine Index: 2.2 (ref 1.2–4.9)
T3 Uptake Ratio: 23 % — ABNORMAL LOW (ref 24–39)
T4, Total: 9.6 ug/dL (ref 4.5–12.0)
TSH: 1.73 u[IU]/mL (ref 0.450–4.500)

## 2023-03-11 LAB — CBC WITH DIFFERENTIAL/PLATELET
Basophils Absolute: 0.1 10*3/uL (ref 0.0–0.2)
Basos: 1 %
EOS (ABSOLUTE): 0.1 10*3/uL (ref 0.0–0.4)
Eos: 1 %
Hematocrit: 34.1 % (ref 34.0–46.6)
Hemoglobin: 10.5 g/dL — ABNORMAL LOW (ref 11.1–15.9)
Immature Grans (Abs): 0 10*3/uL (ref 0.0–0.1)
Immature Granulocytes: 0 %
Lymphocytes Absolute: 2.4 10*3/uL (ref 0.7–3.1)
Lymphs: 24 %
MCH: 25.1 pg — ABNORMAL LOW (ref 26.6–33.0)
MCHC: 30.8 g/dL — ABNORMAL LOW (ref 31.5–35.7)
MCV: 82 fL (ref 79–97)
Monocytes Absolute: 0.5 10*3/uL (ref 0.1–0.9)
Monocytes: 5 %
Neutrophils Absolute: 6.7 10*3/uL (ref 1.4–7.0)
Neutrophils: 69 %
Platelets: 447 10*3/uL (ref 150–450)
RBC: 4.18 x10E6/uL (ref 3.77–5.28)
RDW: 15 % (ref 11.7–15.4)
WBC: 9.6 10*3/uL (ref 3.4–10.8)

## 2023-03-11 LAB — VITAMIN D 25 HYDROXY (VIT D DEFICIENCY, FRACTURES): Vit D, 25-Hydroxy: 13 ng/mL — ABNORMAL LOW (ref 30.0–100.0)

## 2023-03-11 MED ORDER — VITAMIN D (ERGOCALCIFEROL) 1.25 MG (50000 UNIT) PO CAPS
50000.0000 [IU] | ORAL_CAPSULE | ORAL | 6 refills | Status: AC
Start: 2023-03-11 — End: ?

## 2023-03-11 NOTE — Addendum Note (Signed)
 Addended by: Sonny Masters on: 03/11/2023 07:53 AM   Modules accepted: Orders

## 2023-03-30 ENCOUNTER — Encounter: Payer: Self-pay | Admitting: Family Medicine

## 2023-05-06 ENCOUNTER — Encounter: Payer: 59 | Admitting: Family Medicine

## 2023-05-09 ENCOUNTER — Encounter: Payer: Self-pay | Admitting: Family Medicine

## 2023-05-09 DIAGNOSIS — L732 Hidradenitis suppurativa: Secondary | ICD-10-CM

## 2023-05-10 MED ORDER — CLINDAMYCIN PHOS (ONCE-DAILY) 1 % EX GEL
1.0000 | Freq: Every day | CUTANEOUS | 3 refills | Status: AC
Start: 2023-05-10 — End: ?

## 2023-05-12 ENCOUNTER — Encounter: Payer: 59 | Admitting: Family Medicine

## 2023-05-18 ENCOUNTER — Other Ambulatory Visit: Payer: Self-pay | Admitting: Family

## 2023-05-18 DIAGNOSIS — I10 Essential (primary) hypertension: Secondary | ICD-10-CM

## 2023-05-18 DIAGNOSIS — R030 Elevated blood-pressure reading, without diagnosis of hypertension: Secondary | ICD-10-CM

## 2023-06-11 ENCOUNTER — Other Ambulatory Visit: Payer: Self-pay | Admitting: Family Medicine

## 2023-06-11 DIAGNOSIS — H6593 Unspecified nonsuppurative otitis media, bilateral: Secondary | ICD-10-CM

## 2023-06-16 ENCOUNTER — Ambulatory Visit (INDEPENDENT_AMBULATORY_CARE_PROVIDER_SITE_OTHER): Admitting: Family Medicine

## 2023-06-16 ENCOUNTER — Encounter: Payer: Self-pay | Admitting: Family Medicine

## 2023-06-16 VITALS — BP 128/72 | HR 90 | Temp 98.7°F | Ht 66.0 in | Wt 179.4 lb

## 2023-06-16 DIAGNOSIS — Z0000 Encounter for general adult medical examination without abnormal findings: Secondary | ICD-10-CM

## 2023-06-16 DIAGNOSIS — I10 Essential (primary) hypertension: Secondary | ICD-10-CM | POA: Diagnosis not present

## 2023-06-16 DIAGNOSIS — K580 Irritable bowel syndrome with diarrhea: Secondary | ICD-10-CM | POA: Diagnosis not present

## 2023-06-16 DIAGNOSIS — E559 Vitamin D deficiency, unspecified: Secondary | ICD-10-CM

## 2023-06-16 DIAGNOSIS — Z1231 Encounter for screening mammogram for malignant neoplasm of breast: Secondary | ICD-10-CM

## 2023-06-16 DIAGNOSIS — Z0001 Encounter for general adult medical examination with abnormal findings: Secondary | ICD-10-CM

## 2023-06-16 DIAGNOSIS — L732 Hidradenitis suppurativa: Secondary | ICD-10-CM

## 2023-06-16 MED ORDER — DICYCLOMINE HCL 20 MG PO TABS
20.0000 mg | ORAL_TABLET | Freq: Three times a day (TID) | ORAL | 1 refills | Status: DC
Start: 2023-06-16 — End: 2023-11-10

## 2023-06-16 MED ORDER — DOXYCYCLINE HYCLATE 100 MG PO TABS
100.0000 mg | ORAL_TABLET | Freq: Every day | ORAL | 0 refills | Status: DC
Start: 2023-06-16 — End: 2023-09-19

## 2023-06-16 MED ORDER — LIDOCAINE 5 % EX OINT
1.0000 | TOPICAL_OINTMENT | CUTANEOUS | 0 refills | Status: AC | PRN
Start: 2023-06-16 — End: ?

## 2023-06-16 NOTE — Progress Notes (Signed)
 Complete physical exam  Patient: Jacqueline Andrade   DOB: 11-12-1980   42 y.o. Female  MRN: [id number]  Subjective:    Chief Complaint  Patient presents with   Annual Exam    Jacqueline Andrade is a 43 y.o. female who presents today for a complete physical exam. She reports consuming a general diet. The patient does not participate in regular exercise at present. She generally feels well. She reports sleeping well. She does have additional problems to discuss today.    Most recent fall risk assessment:    11/15/2022   10:01 AM  Fall Risk   Falls in the past year? 0  Number falls in past yr: 0  Injury with Fall? 0  Risk for fall due to : Other (Comment)  Follow up Falls evaluation completed;Education provided     Most recent depression screenings:    11/15/2022   10:01 AM 03/03/2022   11:59 AM  PHQ 2/9 Scores  PHQ - 2 Score 0 0  PHQ- 9 Score 0 3    Vision:Within last year and Dental: No current dental problems  Patient Active Problem List   Diagnosis Date Noted   Vitamin D  deficiency 06/16/2023   Primary hypertension 12/07/2022   Chronic ankle pain 03/14/2017   History of ankle fusion 03/14/2017   Obesity (BMI 30-39.9) 08/20/2016   Irritable bowel syndrome with diarrhea 08/20/2016   Hidradenitis suppurativa 08/20/2016   Arthritis of ankle, right 08/01/2014   History reviewed. No pertinent past medical history. Past Surgical History:  Procedure Laterality Date   ANKLE SURGERY Right    BUNIONECTOMY Bilateral    CHOLECYSTECTOMY OPEN     KNEE SURGERY Right    TUBAL LIGATION     Social History   Tobacco Use   Smoking status: Never   Smokeless tobacco: Never  Vaping Use   Vaping status: Never Used  Substance Use Topics   Alcohol use: Yes    Comment: rare   Drug use: No   Social History   Socioeconomic History   Marital status: Significant Other    Spouse name: Not on file   Number of children: Not on file   Years of education: Not on file    Highest education level: Not on file  Occupational History   Not on file  Tobacco Use   Smoking status: Never   Smokeless tobacco: Never  Vaping Use   Vaping status: Never Used  Substance and Sexual Activity   Alcohol use: Yes    Comment: rare   Drug use: No   Sexual activity: Yes    Birth control/protection: Surgical  Other Topics Concern   Not on file  Social History Narrative   Not on file   Social Drivers of Health   Financial Resource Strain: Not on file  Food Insecurity: Not on file  Transportation Needs: Not on file  Physical Activity: Not on file  Stress: Not on file  Social Connections: Not on file  Intimate Partner Violence: Not on file   Family Status  Relation Name Status   Mother  Alive   Father  Alive   Other  (Not Specified)   Neg Hx  (Not Specified)  No partnership data on file   Family History  Problem Relation Age of Onset   Diabetes Mother    Hypertension Mother    Hyperlipidemia Mother    Glaucoma Mother    Diabetes Father    Hyperlipidemia Father    Glaucoma Father  Glaucoma Other    Breast cancer Neg Hx    No Known Allergies    Patient Care Team: Galvin Jules, FNP as PCP - General (Family Medicine)   Outpatient Medications Prior to Visit  Medication Sig   Chlorhexidine Gluconate  (CVS ANTISEPTIC SKIN CLEANSER) 4 % SOLN APPLY TO AFFECTED AREA EVERY DAY AS NEEDED   Clindamycin  Phos, Once-Daily, (CLINDAGEL) 1 % GEL Apply 1 Application topically daily.   CVS ANTISEPTIC SKIN CLEANSER 4 % SOLN Apply topically daily as needed.   diclofenac  (VOLTAREN ) 75 MG EC tablet Take 1 tablet (75 mg total) by mouth 2 (two) times daily.   diclofenac  sodium (VOLTAREN ) 1 % GEL Apply topically 4 (four) times daily.   fluticasone  (FLONASE ) 50 MCG/ACT nasal spray SPRAY 2 SPRAYS INTO EACH NOSTRIL EVERY DAY   losartan  (COZAAR ) 50 MG tablet TAKE 1 TABLET BY MOUTH EVERY DAY   Vitamin D , Ergocalciferol , (DRISDOL ) 1.25 MG (50000 UNIT) CAPS capsule Take 1  capsule (50,000 Units total) by mouth every 7 (seven) days.   [DISCONTINUED] dicyclomine  (BENTYL ) 20 MG tablet TAKE 1 TABLET (20 MG TOTAL) BY MOUTH 4 (FOUR) TIMES DAILY - BEFORE MEALS AND AT BEDTIME.   [DISCONTINUED] doxycycline  (VIBRA -TABS) 100 MG tablet Take 100 mg by mouth 2 (two) times daily.   No facility-administered medications prior to visit.    Review of Systems  Skin:  Positive for rash (HS has become worse).  Psychiatric/Behavioral:  The patient has insomnia.   All other systems reviewed and are negative.         Objective:     BP 128/72   Pulse 90   Temp 98.7 F (37.1 C)   Ht 5' 6 (1.676 m)   Wt 179 lb 6.4 oz (81.4 kg)   SpO2 97%   BMI 28.96 kg/m  BP Readings from Last 3 Encounters:  06/16/23 128/72  03/10/23 131/84  12/07/22 104/64   Wt Readings from Last 3 Encounters:  06/16/23 179 lb 6.4 oz (81.4 kg)  03/10/23 195 lb 12.8 oz (88.8 kg)  12/07/22 193 lb (87.5 kg)   SpO2 Readings from Last 3 Encounters:  06/16/23 97%  03/10/23 97%  12/07/22 98%      Physical Exam Vitals and nursing note reviewed.  Constitutional:      General: She is not in acute distress.    Appearance: Normal appearance. She is obese. She is not ill-appearing, toxic-appearing or diaphoretic.  HENT:     Head: Normocephalic and atraumatic.     Right Ear: Tympanic membrane, ear canal and external ear normal.     Left Ear: Tympanic membrane, ear canal and external ear normal.     Nose: Nose normal.     Mouth/Throat:     Mouth: Mucous membranes are moist.     Pharynx: Oropharynx is clear.   Eyes:     Conjunctiva/sclera: Conjunctivae normal.     Pupils: Pupils are equal, round, and reactive to light.    Cardiovascular:     Rate and Rhythm: Normal rate and regular rhythm.     Heart sounds: Normal heart sounds.  Pulmonary:     Effort: Pulmonary effort is normal.     Breath sounds: Normal breath sounds.  Abdominal:     Palpations: Abdomen is soft.   Musculoskeletal:         General: Normal range of motion.     Cervical back: Normal range of motion and neck supple.     Right lower leg: No edema.  Left lower leg: No edema.   Skin:    General: Skin is warm and dry.     Capillary Refill: Capillary refill takes less than 2 seconds.   Neurological:     General: No focal deficit present.     Mental Status: She is alert and oriented to person, place, and time.   Psychiatric:        Mood and Affect: Mood normal.        Behavior: Behavior normal.        Thought Content: Thought content normal.        Judgment: Judgment normal.      Last CBC Lab Results  Component Value Date   WBC 9.6 03/10/2023   HGB 10.5 (L) 03/10/2023   HCT 34.1 03/10/2023   MCV 82 03/10/2023   MCH 25.1 (L) 03/10/2023   RDW 15.0 03/10/2023   PLT 447 03/10/2023   Last metabolic panel Lab Results  Component Value Date   GLUCOSE 116 (H) 03/10/2023   NA 139 03/10/2023   K 4.3 03/10/2023   CL 104 03/10/2023   CO2 21 03/10/2023   BUN 13 03/10/2023   CREATININE 0.63 03/10/2023   EGFR 114 03/10/2023   CALCIUM 9.6 03/10/2023   PROT 8.4 03/10/2023   ALBUMIN 4.0 03/10/2023   LABGLOB 4.4 03/10/2023   AGRATIO 1.0 (L) 12/09/2021   BILITOT 0.4 03/10/2023   ALKPHOS 129 (H) 03/10/2023   AST 21 03/10/2023   ALT 23 03/10/2023   Last lipids Lab Results  Component Value Date   CHOL 208 (H) 12/09/2021   HDL 48 12/09/2021   LDLCALC 115 (H) 12/09/2021   TRIG 257 (H) 12/09/2021   CHOLHDL 4.3 12/09/2021   Last hemoglobin A1c Lab Results  Component Value Date   HGBA1C 5.5 12/07/2022   Last thyroid  functions Lab Results  Component Value Date   TSH 1.730 03/10/2023   T4TOTAL 9.6 03/10/2023   Last vitamin D  Lab Results  Component Value Date   VD25OH 13.0 (L) 03/10/2023   Last vitamin B12 and Folate Lab Results  Component Value Date   VITAMINB12 324 04/27/2022   FOLATE 17.4 04/27/2022        Assessment & Plan:    Routine Health Maintenance and Physical  Exam  Immunization History  Administered Date(s) Administered   Influenza,inj,Quad PF,6+ Mos 10/29/2013, 10/04/2014, 02/20/2018   Td 05/04/2005, 08/20/2016   Td (Adult), 2 Lf Tetanus Toxid, Preservative Free 05/04/2005, 08/20/2016   Tdap 05/04/2005    Health Maintenance  Topic Date Due   MAMMOGRAM  03/05/2022   COVID-19 Vaccine (1 - 2024-25 season) 07/02/2023 (Originally 09/05/2022)   HPV VACCINES (1 - 3-dose series) 06/15/2024 (Originally 07/17/1995)   INFLUENZA VACCINE  08/05/2023   Cervical Cancer Screening (HPV/Pap Cotest)  12/03/2023   DTaP/Tdap/Td (6 - Td or Tdap) 08/21/2026   Hepatitis C Screening  Completed   HIV Screening  Completed   Meningococcal B Vaccine  Aged Out    Discussed health benefits of physical activity, and encouraged her to engage in regular exercise appropriate for her age and condition.  Problem List Items Addressed This Visit       Cardiovascular and Mediastinum   Primary hypertension   Relevant Orders   CBC with Differential/Platelet   CMP14+EGFR   Lipid panel   Thyroid  Panel With TSH     Digestive   Irritable bowel syndrome with diarrhea   Relevant Medications   dicyclomine  (BENTYL ) 20 MG tablet   Other Relevant Orders  CBC with Differential/Platelet   CMP14+EGFR     Musculoskeletal and Integument   Hidradenitis suppurativa   Relevant Medications   lidocaine  (XYLOCAINE ) 5 % ointment   doxycycline  (VIBRA -TABS) 100 MG tablet   Other Relevant Orders   CBC with Differential/Platelet   Ambulatory referral to Dermatology     Other   Vitamin D  deficiency   Relevant Orders   CMP14+EGFR   Vitamin D , 25-hydroxy   Other Visit Diagnoses       Annual physical exam    -  Primary   Relevant Orders   CBC with Differential/Platelet   CMP14+EGFR   MM Digital Screening     Encounter for screening mammogram for malignant neoplasm of breast       Relevant Orders   MM Digital Screening      Return in about 1 year (around 06/15/2024), or if  symptoms worsen or fail to improve, for Annual Physical with PAP.     Kattie Parrot, FNP

## 2023-06-17 ENCOUNTER — Ambulatory Visit: Payer: Self-pay | Admitting: Family Medicine

## 2023-06-17 LAB — CBC WITH DIFFERENTIAL/PLATELET
Basophils Absolute: 0.1 10*3/uL (ref 0.0–0.2)
Basos: 1 %
EOS (ABSOLUTE): 0.1 10*3/uL (ref 0.0–0.4)
Eos: 1 %
Hematocrit: 33 % — ABNORMAL LOW (ref 34.0–46.6)
Hemoglobin: 10 g/dL — ABNORMAL LOW (ref 11.1–15.9)
Immature Grans (Abs): 0 10*3/uL (ref 0.0–0.1)
Immature Granulocytes: 0 %
Lymphocytes Absolute: 2 10*3/uL (ref 0.7–3.1)
Lymphs: 22 %
MCH: 25.2 pg — ABNORMAL LOW (ref 26.6–33.0)
MCHC: 30.3 g/dL — ABNORMAL LOW (ref 31.5–35.7)
MCV: 83 fL (ref 79–97)
Monocytes Absolute: 0.5 10*3/uL (ref 0.1–0.9)
Monocytes: 5 %
Neutrophils Absolute: 6.2 10*3/uL (ref 1.4–7.0)
Neutrophils: 71 %
Platelets: 441 10*3/uL (ref 150–450)
RBC: 3.97 x10E6/uL (ref 3.77–5.28)
RDW: 15.9 % — ABNORMAL HIGH (ref 11.7–15.4)
WBC: 8.8 10*3/uL (ref 3.4–10.8)

## 2023-06-17 LAB — CMP14+EGFR
ALT: 39 IU/L — ABNORMAL HIGH (ref 0–32)
AST: 38 IU/L (ref 0–40)
Albumin: 3.8 g/dL — ABNORMAL LOW (ref 3.9–4.9)
Alkaline Phosphatase: 159 IU/L — ABNORMAL HIGH (ref 44–121)
BUN/Creatinine Ratio: 21 (ref 9–23)
BUN: 15 mg/dL (ref 6–24)
Bilirubin Total: 0.3 mg/dL (ref 0.0–1.2)
CO2: 19 mmol/L — ABNORMAL LOW (ref 20–29)
Calcium: 9 mg/dL (ref 8.7–10.2)
Chloride: 106 mmol/L (ref 96–106)
Creatinine, Ser: 0.72 mg/dL (ref 0.57–1.00)
Globulin, Total: 3.9 g/dL (ref 1.5–4.5)
Glucose: 98 mg/dL (ref 70–99)
Potassium: 4.1 mmol/L (ref 3.5–5.2)
Sodium: 138 mmol/L (ref 134–144)
Total Protein: 7.7 g/dL (ref 6.0–8.5)
eGFR: 107 mL/min/{1.73_m2} (ref >59)

## 2023-06-17 LAB — LIPID PANEL
Chol/HDL Ratio: 4.4 ratio (ref 0.0–4.4)
Cholesterol, Total: 185 mg/dL (ref 100–199)
HDL: 42 mg/dL (ref >39)
LDL Chol Calc (NIH): 94 mg/dL (ref 0–99)
Triglycerides: 294 mg/dL — ABNORMAL HIGH (ref 0–149)
VLDL Cholesterol Cal: 49 mg/dL — ABNORMAL HIGH (ref 5–40)

## 2023-06-17 LAB — THYROID PANEL WITH TSH
Free Thyroxine Index: 2.1 (ref 1.2–4.9)
T3 Uptake Ratio: 22 % — ABNORMAL LOW (ref 24–39)
T4, Total: 9.5 ug/dL (ref 4.5–12.0)
TSH: 1.84 u[IU]/mL (ref 0.450–4.500)

## 2023-06-17 LAB — VITAMIN D 25 HYDROXY (VIT D DEFICIENCY, FRACTURES): Vit D, 25-Hydroxy: 31.1 ng/mL (ref 30.0–100.0)

## 2023-06-24 LAB — SPECIMEN STATUS REPORT

## 2023-06-24 LAB — AMYLASE ISOENZYMES
Amylase: 37 U/L (ref 31–110)
Pancreatic Amylase, S: 22 U/L (ref 14–55)
Salivary Amyl. Calc.: 15 U/L (ref 11–83)

## 2023-09-12 ENCOUNTER — Encounter: Payer: Self-pay | Admitting: Family Medicine

## 2023-09-14 DIAGNOSIS — Z0279 Encounter for issue of other medical certificate: Secondary | ICD-10-CM

## 2023-09-14 NOTE — Telephone Encounter (Signed)
 pt dropped off FMLA forms to be completed and signed.  Form Fee Paid? (Y/N)       YES     If NO, form is placed on front office manager desk to hold until payment received. If YES, then form will be placed in the RX/HH Nurse Coordinators box for completion.  Form will not be processed until payment is received

## 2023-09-18 ENCOUNTER — Other Ambulatory Visit: Payer: Self-pay | Admitting: Family Medicine

## 2023-09-18 DIAGNOSIS — L732 Hidradenitis suppurativa: Secondary | ICD-10-CM

## 2023-10-17 IMAGING — US US BREAST*R* LIMITED INC AXILLA
1 series · 7 of 7 positions shown · non-contrast
Comparison: 12/05/2020

CLINICAL DATA: Patient returns after screening study for evaluation
of possible RIGHT breast mass and possible LEFT breast
asymmetry/adenopathy. History of hidradenitis suppurativa.



[Series 1: us breast*right* limited inc axilla · 0.06mm/px · 7 of 7 slices shown]
[im 1/7]
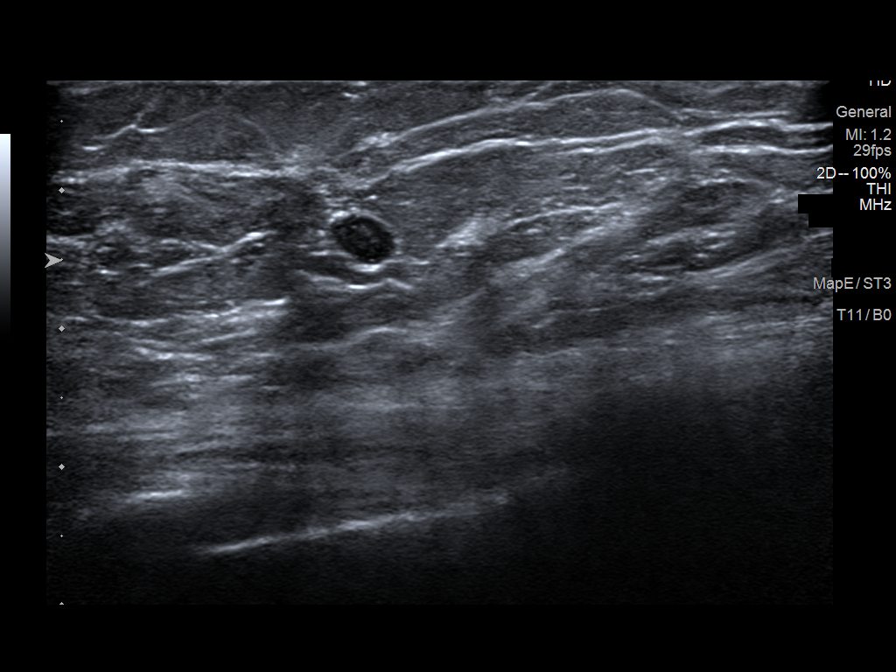
[im 2/7]
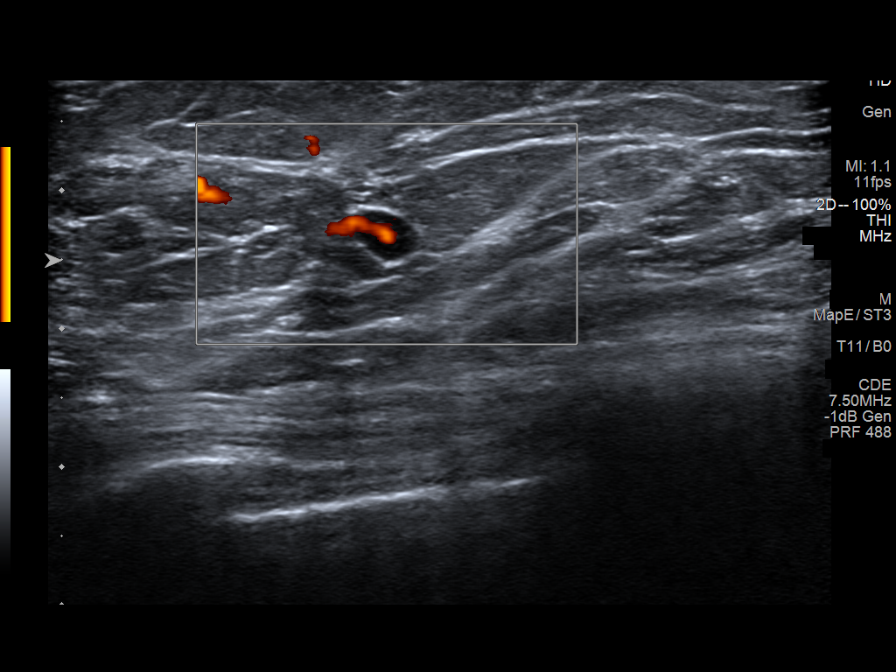
[im 3/7]
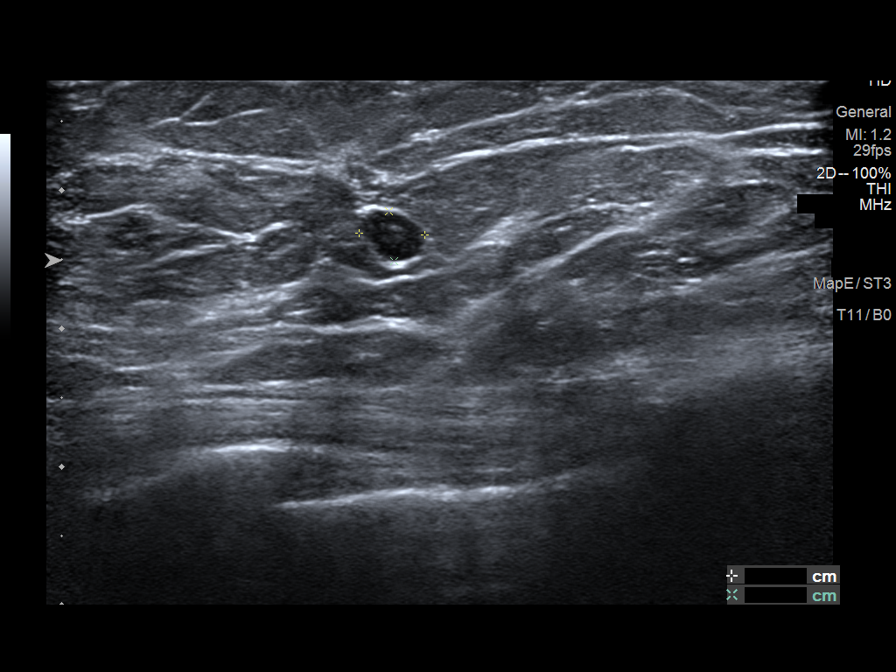
[im 4/7]
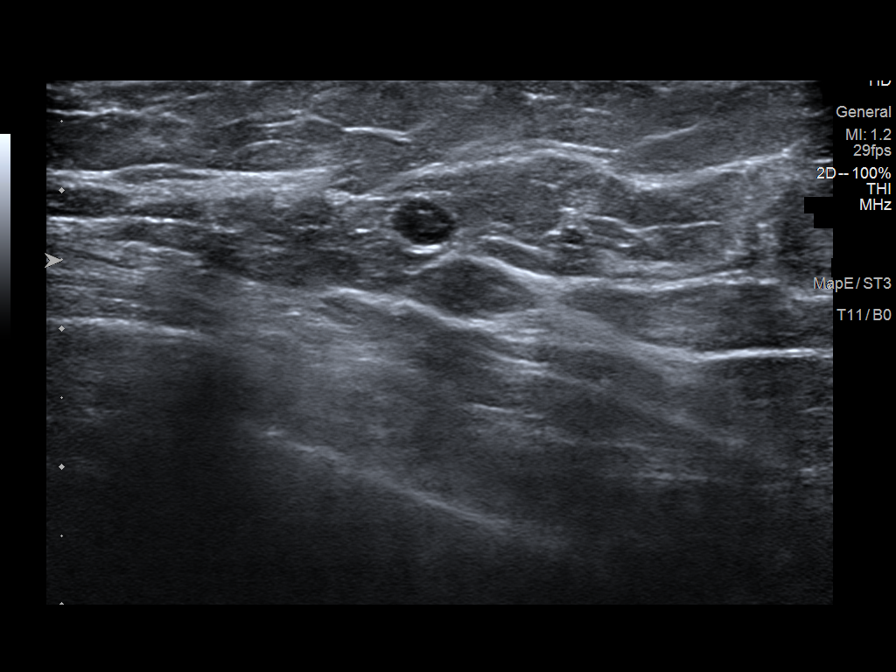
[im 5/7]
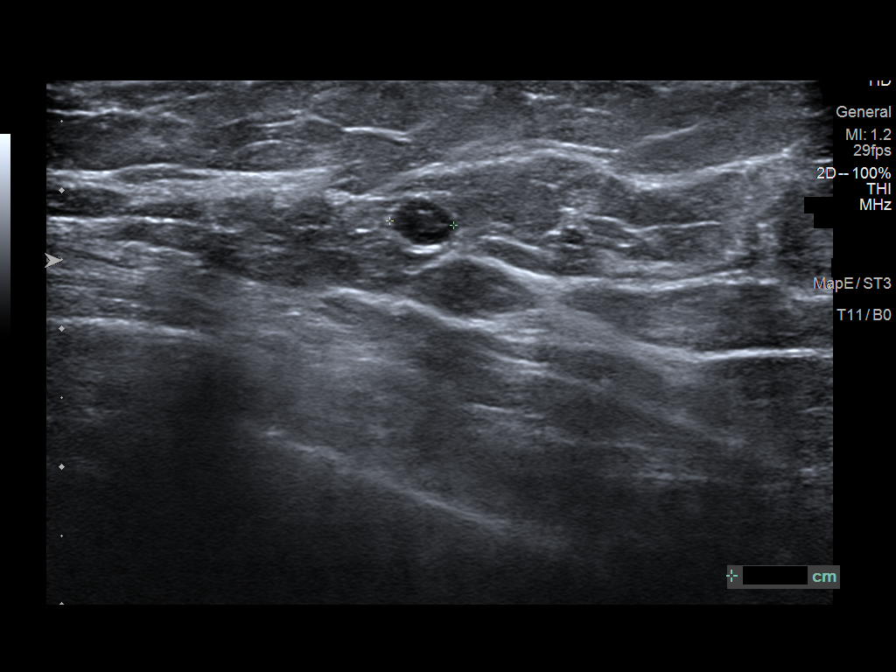
[im 6/7]
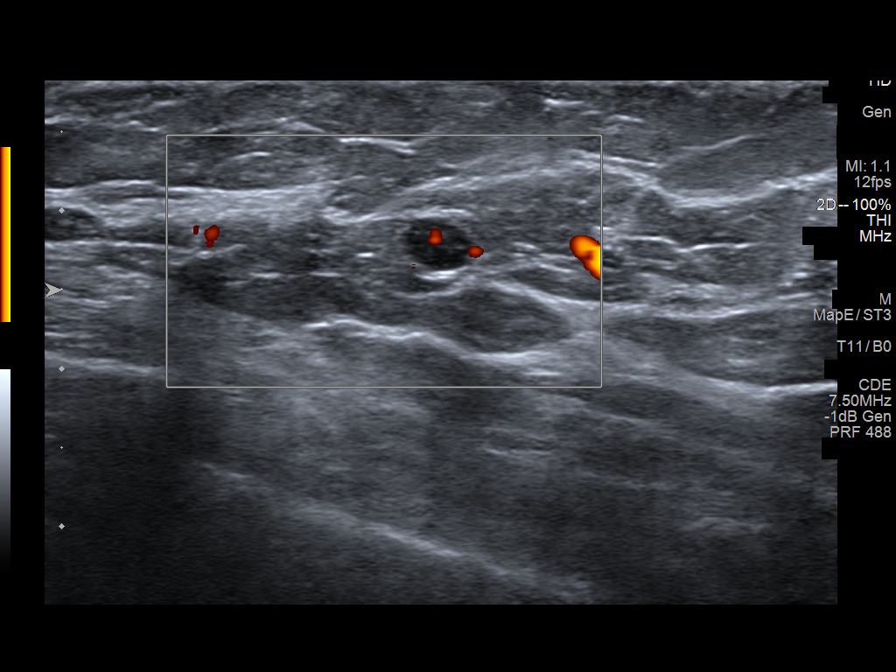
[im 7/7]
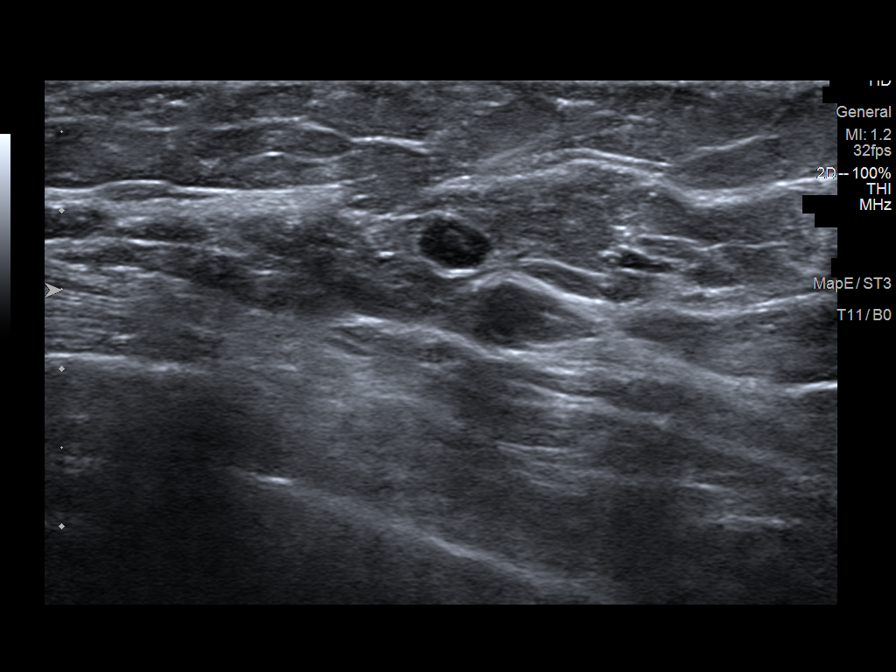

[7 of 7 positions shown; findings below may reference images not displayed]

ACR Breast Density Category b: There are scattered areas of
fibroglandular density.
FINDINGS: RIGHT BREAST:

Mammogram: Additional 2-D and 3-D images are performed. These views
confirm presence of circumscribed oval mass in the UPPER OUTER
QUADRANT of the RIGHT breast. Mammographic images were processed
with CAD.

Ultrasound: Targeted ultrasound is performed, showing a
circumscribed oval hypoechoic mass in the 9 o'clock location of the
RIGHT breast 6 centimeters from the nipple measuring 0.5 x 0.4 x 0 5
centimeters. Internal blood flow is identified, consistent with
benign intramammary lymph node.

LEFT BREAST:

Mammogram: Additional 2-D and 3-D images are performed. These views
show no persistent asymmetry in the retroareolar region of the LEFT
breast. Additional views showed mildly prominent LOWER LEFT axillary
lymph nodes. Mammographic images were processed with CAD.

Physical Exam: There are chronic changes of hidradenitis in the LEFT
axilla.

Ultrasound: Targeted ultrasound is performed, showing normal
appearance of the retroareolar region of the LEFT breast.

Evaluation of the LEFT axilla shows lymph nodes with mildly
prominent cortex.
IMPRESSION: 1.  No mammographic or ultrasound evidence for malignancy.
2. Mildly prominent LEFT axillary lymph nodes are favored to
represent reactive changes from chronic hidradenitis.

RECOMMENDATION:
Screening mammogram is recommended in 1 year.

I have discussed the findings and recommendations with the patient.
If applicable, a reminder letter will be sent to the patient
regarding the next appointment.

BI-RADS CATEGORY  2: Benign.

## 2023-11-10 ENCOUNTER — Other Ambulatory Visit: Payer: Self-pay | Admitting: *Deleted

## 2023-11-10 DIAGNOSIS — K580 Irritable bowel syndrome with diarrhea: Secondary | ICD-10-CM
